# Patient Record
Sex: Male | Born: 1975 | Race: White | Hispanic: No | Marital: Married | Smoking: Current every day smoker
Health system: Southern US, Community
[De-identification: ages and names within clinical notes are randomized; demographics above are authoritative.]

## PROBLEM LIST (undated history)

## (undated) DIAGNOSIS — M1990 Unspecified osteoarthritis, unspecified site: Secondary | ICD-10-CM

## (undated) DIAGNOSIS — C801 Malignant (primary) neoplasm, unspecified: Secondary | ICD-10-CM

## (undated) DIAGNOSIS — Z889 Allergy status to unspecified drugs, medicaments and biological substances status: Secondary | ICD-10-CM

## (undated) DIAGNOSIS — K219 Gastro-esophageal reflux disease without esophagitis: Secondary | ICD-10-CM

## (undated) HISTORY — PX: KNEE ARTHROSCOPY: SHX127

## (undated) HISTORY — PX: BOWEL RESECTION: SHX1257

## (undated) HISTORY — PX: HERNIA REPAIR: SHX51

## (undated) HISTORY — PX: INGUINAL HERNIA REPAIR: SUR1180

---

## 2004-05-20 ENCOUNTER — Other Ambulatory Visit: Admission: RE | Admit: 2004-05-20 | Discharge: 2004-05-20 | Payer: Self-pay | Admitting: Dermatology

## 2005-10-22 ENCOUNTER — Ambulatory Visit (HOSPITAL_BASED_OUTPATIENT_CLINIC_OR_DEPARTMENT_OTHER): Admission: RE | Admit: 2005-10-22 | Discharge: 2005-10-22 | Payer: Self-pay | Admitting: General Surgery

## 2005-10-22 ENCOUNTER — Ambulatory Visit (HOSPITAL_COMMUNITY): Admission: RE | Admit: 2005-10-22 | Discharge: 2005-10-22 | Payer: Self-pay | Admitting: General Surgery

## 2017-09-20 ENCOUNTER — Emergency Department (HOSPITAL_COMMUNITY)
Admission: EM | Admit: 2017-09-20 | Discharge: 2017-09-20 | Disposition: A | Discharge status: Home / Self Care | Payer: BLUE CROSS/BLUE SHIELD | Attending: Emergency Medicine | Admitting: Emergency Medicine

## 2017-09-20 ENCOUNTER — Emergency Department (HOSPITAL_COMMUNITY): Payer: BLUE CROSS/BLUE SHIELD

## 2017-09-20 ENCOUNTER — Other Ambulatory Visit: Payer: Self-pay

## 2017-09-20 ENCOUNTER — Encounter (HOSPITAL_COMMUNITY): Payer: Self-pay

## 2017-09-20 DIAGNOSIS — Y929 Unspecified place or not applicable: Secondary | ICD-10-CM | POA: Diagnosis not present

## 2017-09-20 DIAGNOSIS — R10812 Left upper quadrant abdominal tenderness: Secondary | ICD-10-CM | POA: Diagnosis not present

## 2017-09-20 DIAGNOSIS — S3991XA Unspecified injury of abdomen, initial encounter: Secondary | ICD-10-CM | POA: Diagnosis present

## 2017-09-20 DIAGNOSIS — R10811 Right upper quadrant abdominal tenderness: Secondary | ICD-10-CM | POA: Diagnosis not present

## 2017-09-20 DIAGNOSIS — S3981XA Other specified injuries of abdomen, initial encounter: Secondary | ICD-10-CM | POA: Insufficient documentation

## 2017-09-20 DIAGNOSIS — W1789XA Other fall from one level to another, initial encounter: Secondary | ICD-10-CM | POA: Insufficient documentation

## 2017-09-20 DIAGNOSIS — F17210 Nicotine dependence, cigarettes, uncomplicated: Secondary | ICD-10-CM | POA: Diagnosis not present

## 2017-09-20 DIAGNOSIS — Y999 Unspecified external cause status: Secondary | ICD-10-CM | POA: Diagnosis not present

## 2017-09-20 DIAGNOSIS — R1011 Right upper quadrant pain: Secondary | ICD-10-CM | POA: Insufficient documentation

## 2017-09-20 DIAGNOSIS — R1012 Left upper quadrant pain: Secondary | ICD-10-CM | POA: Diagnosis not present

## 2017-09-20 DIAGNOSIS — Y939 Activity, unspecified: Secondary | ICD-10-CM | POA: Diagnosis not present

## 2017-09-20 LAB — COMPREHENSIVE METABOLIC PANEL
ALK PHOS: 88 U/L (ref 38–126)
ALT: 28 U/L (ref 17–63)
ANION GAP: 8 (ref 5–15)
AST: 33 U/L (ref 15–41)
Albumin: 4.5 g/dL (ref 3.5–5.0)
BILIRUBIN TOTAL: 0.7 mg/dL (ref 0.3–1.2)
BUN: 8 mg/dL (ref 6–20)
CO2: 24 mmol/L (ref 22–32)
Calcium: 9.1 mg/dL (ref 8.9–10.3)
Chloride: 102 mmol/L (ref 101–111)
Creatinine, Ser: 1 mg/dL (ref 0.61–1.24)
GLUCOSE: 106 mg/dL — AB (ref 65–99)
POTASSIUM: 3.3 mmol/L — AB (ref 3.5–5.1)
Sodium: 134 mmol/L — ABNORMAL LOW (ref 135–145)
TOTAL PROTEIN: 7.8 g/dL (ref 6.5–8.1)

## 2017-09-20 LAB — CBC WITH DIFFERENTIAL/PLATELET
BASOS ABS: 0 10*3/uL (ref 0.0–0.1)
BASOS PCT: 0 %
Eosinophils Absolute: 0 10*3/uL (ref 0.0–0.7)
Eosinophils Relative: 0 %
HEMATOCRIT: 44.2 % (ref 39.0–52.0)
HEMOGLOBIN: 14.6 g/dL (ref 13.0–17.0)
LYMPHS ABS: 2.1 10*3/uL (ref 0.7–4.0)
LYMPHS PCT: 8 %
MCH: 32.3 pg (ref 26.0–34.0)
MCHC: 33 g/dL (ref 30.0–36.0)
MCV: 97.8 fL (ref 78.0–100.0)
MONOS PCT: 6 %
Monocytes Absolute: 1.6 10*3/uL — ABNORMAL HIGH (ref 0.1–1.0)
NEUTROS ABS: 22.8 10*3/uL — AB (ref 1.7–7.7)
Neutrophils Relative %: 86 %
Platelets: 203 10*3/uL (ref 150–400)
RBC: 4.52 MIL/uL (ref 4.22–5.81)
RDW: 13.1 % (ref 11.5–15.5)
WBC: 26.5 10*3/uL — ABNORMAL HIGH (ref 4.0–10.5)

## 2017-09-20 LAB — I-STAT CHEM 8, ED
BUN: 6 mg/dL (ref 6–20)
CREATININE: 1 mg/dL (ref 0.61–1.24)
Calcium, Ion: 1.21 mmol/L (ref 1.15–1.40)
Chloride: 103 mmol/L (ref 101–111)
GLUCOSE: 105 mg/dL — AB (ref 65–99)
HEMATOCRIT: 48 % (ref 39.0–52.0)
HEMOGLOBIN: 16.3 g/dL (ref 13.0–17.0)
POTASSIUM: 3.5 mmol/L (ref 3.5–5.1)
Sodium: 141 mmol/L (ref 135–145)
TCO2: 29 mmol/L (ref 22–32)

## 2017-09-20 LAB — URINALYSIS, ROUTINE W REFLEX MICROSCOPIC
Bilirubin Urine: NEGATIVE
GLUCOSE, UA: NEGATIVE mg/dL
Ketones, ur: NEGATIVE mg/dL
LEUKOCYTES UA: NEGATIVE
NITRITE: NEGATIVE
PH: 6 (ref 5.0–8.0)
Protein, ur: 30 mg/dL — AB
SPECIFIC GRAVITY, URINE: 1.014 (ref 1.005–1.030)

## 2017-09-20 LAB — LIPASE, BLOOD: LIPASE: 31 U/L (ref 11–51)

## 2017-09-20 MED ORDER — ONDANSETRON HCL 4 MG PO TABS
4.0000 mg | ORAL_TABLET | Freq: Three times a day (TID) | ORAL | 0 refills | Status: DC | PRN
Start: 2017-09-20 — End: 2019-12-11

## 2017-09-20 MED ORDER — DOCUSATE SODIUM 100 MG PO CAPS
100.0000 mg | ORAL_CAPSULE | Freq: Once | ORAL | Status: AC
  Administered 2017-09-20: 100 mg via ORAL
  Filled 2017-09-20: qty 1

## 2017-09-20 MED ORDER — ONDANSETRON HCL 4 MG/2ML IJ SOLN
8.0000 mg | Freq: Once | INTRAMUSCULAR | Status: AC
  Administered 2017-09-20: 8 mg via INTRAVENOUS
  Filled 2017-09-20: qty 4

## 2017-09-20 MED ORDER — IOPAMIDOL (ISOVUE-300) INJECTION 61%
100.0000 mL | Freq: Once | INTRAVENOUS | Status: AC | PRN
  Administered 2017-09-20: 100 mL via INTRAVENOUS

## 2017-09-20 MED ORDER — MORPHINE SULFATE (PF) 4 MG/ML IV SOLN
4.0000 mg | Freq: Once | INTRAVENOUS | Status: AC
  Administered 2017-09-20: 4 mg via INTRAVENOUS
  Filled 2017-09-20: qty 1

## 2017-09-20 MED ORDER — ONDANSETRON HCL 4 MG PO TABS: 4.0000 mg | ORAL_TABLET | Freq: Three times a day (TID) | ORAL | 0 refills | Status: DC | PRN

## 2017-09-20 MED ORDER — OXYCODONE-ACETAMINOPHEN 5-325 MG PO TABS: 1.0000 | ORAL_TABLET | ORAL | 0 refills | Status: DC | PRN

## 2017-09-20 MED ORDER — HYDROCODONE-ACETAMINOPHEN 5-325 MG PO TABS: 1.0000 | ORAL_TABLET | Freq: Once | ORAL | Status: DC

## 2017-09-20 NOTE — ED Notes (Signed)
Pt c/o nausea. EDP notified and gave order for Zofran 8mg  IV.

## 2017-09-20 NOTE — ED Provider Notes (Signed)
Emergency Department Provider Note   I have reviewed the triage vital signs and the nursing notes.   HISTORY  Chief Complaint Abdominal Injury   HPI Roger Haney is a 41 y.o. male without significant past medical history presents after a fall.  States he is down on top of a skid steer lost his footing fell down landing on his upper abdomen on a bar that goes across the back viscus tear.  Same done he had his shin on the top of the cage but mostly is concerned about severe abdominal pain this time.  States it is on his bilateral upper quadrants and consistently since the fall without change.  Has not tried anything at home for his symptoms.  No injuries elsewhere.  No other associated or modifying symptoms.  No alcohol or drugs today.   History reviewed. No pertinent past medical history.  There are no active problems to display for this patient.   History reviewed. No pertinent surgical history.    Allergies Patient has no known allergies.  No family history on file.  Social History Social History   Tobacco Use  . Smoking status: Current Every Day Smoker    Packs/day: 1.00    Types: Cigarettes  . Smokeless tobacco: Never Used  Substance Use Topics  . Alcohol use: Yes    Comment: occ  . Drug use: No    Review of Systems  All other systems negative except as documented in the HPI. All pertinent positives and negatives as reviewed in the HPI. ____________________________________________   PHYSICAL EXAM:  VITAL SIGNS: ED Triage Vitals  Enc Vitals Group     BP 09/20/17 1405 114/75     Pulse Rate 09/20/17 1405 75     Resp 09/20/17 1405 16     Temp 09/20/17 1405 (!) 97.5 F (36.4 C)     Temp Source 09/20/17 1405 Oral     SpO2 09/20/17 1405 98 %     Weight 09/20/17 1405 140 lb (63.5 kg)     Height 09/20/17 1405 5\' 8"  (1.727 m)    Constitutional: Alert and oriented. Well appearing and in no acute distress. Eyes: Conjunctivae are normal. PERRL.  EOMI. Head: Atraumatic. Chin without obvious injury or deformity.  Nose: No congestion/rhinnorhea. Mouth/Throat: normal dentition, no malocclusion. Mucous membranes are moist.  Oropharynx non-erythematous. Neck: No stridor.  No meningeal signs.   Cardiovascular: Normal rate, regular rhythm. Good peripheral circulation. Grossly normal heart sounds.   Respiratory: Normal respiratory effort.  No retractions. Lungs CTAB. Gastrointestinal: Soft and tenderness with guarding to bilateral upper quadrants and epigastrium. No distention.  Musculoskeletal: No lower extremity tenderness nor edema. No gross deformities of extremities. Neurologic:  Normal speech and language. No gross focal neurologic deficits are appreciated.  Skin:  Skin is warm, dry and intact. No rash noted.   ____________________________________________   LABS (all labs ordered are listed, but only abnormal results are displayed)  Labs Reviewed  CBC WITH DIFFERENTIAL/PLATELET - Abnormal; Notable for the following components:      Result Value   WBC 26.5 (*)    Neutro Abs 22.8 (*)    Monocytes Absolute 1.6 (*)    All other components within normal limits  COMPREHENSIVE METABOLIC PANEL - Abnormal; Notable for the following components:   Sodium 134 (*)    Potassium 3.3 (*)    Glucose, Bld 106 (*)    All other components within normal limits  URINALYSIS, ROUTINE W REFLEX MICROSCOPIC - Abnormal; Notable for  the following components:   APPearance HAZY (*)    Hgb urine dipstick SMALL (*)    Protein, ur 30 (*)    Bacteria, UA RARE (*)    Squamous Epithelial / LPF 0-5 (*)    All other components within normal limits  I-STAT CHEM 8, ED - Abnormal; Notable for the following components:   Glucose, Bld 105 (*)    All other components within normal limits  LIPASE, BLOOD   ____________________________________________  RADIOLOGY  Ct Chest W Contrast  Result Date: 09/20/2017 CLINICAL DATA:  Pain after fall today. Patient fell  onto a bobcat machine off another bobcat. Severe abdominal pain. EXAM: CT CHEST, ABDOMEN, AND PELVIS WITH CONTRAST TECHNIQUE: Multidetector CT imaging of the chest, abdomen and pelvis was performed following the standard protocol during bolus administration of intravenous contrast. CONTRAST:  100mL ISOVUE-300 IOPAMIDOL (ISOVUE-300) INJECTION 61% COMPARISON:  None. FINDINGS: CT CHEST FINDINGS Cardiovascular: Normal branch pattern of the great vessels. No aortic aneurysm or dissection. No large central pulmonary embolus. No pericardial effusion. Normal heart size. Mediastinum/Nodes: No mediastinal widening nor evidence of mediastinal hematoma. No adenopathy. Intact trachea esophagus. Unremarkable thyroid. Lungs/Pleura: Bibasilar dependent atelectasis. Upper lobe predominant centrilobular emphysema with small subpleural bleb in the right upper lobe. Musculoskeletal: No chest wall mass or suspicious bone lesions identified. CT ABDOMEN PELVIS FINDINGS Hepatobiliary: No hepatic injury or perihepatic hematoma. Gallbladder is unremarkable Pancreas: Unremarkable. No pancreatic ductal dilatation or surrounding inflammatory changes. Spleen: No splenic injury or perisplenic hematoma. Adrenals/Urinary Tract: No adrenal hemorrhage or renal injury identified. Bladder is unremarkable. Stomach/Bowel: Stomach is within normal limits. Appendix appears normal. No evidence of bowel wall thickening, distention, or inflammatory changes. Vascular/Lymphatic: No significant vascular findings are present. No enlarged abdominal or pelvic lymph nodes. Reproductive: Prostate is unremarkable. Other: No abdominal wall hernia or abnormality. Trace to small amount of free fluid in the pelvis. Musculoskeletal: No acute or significant osseous findings. IMPRESSION: 1. Centrilobular emphysema, upper lobe predominant. No pulmonary contusion, effusion or pneumothorax. 2. No evidence of mediastinal hematoma. 3. No acute solid nor hollow visceral organ  injury. Trace to small amount of nonspecific free fluid in the pelvis without etiology. Electronically Signed   By: Tollie Ethavid  Kwon M.D.   On: 09/20/2017 17:22   Ct Abdomen Pelvis W Contrast  Result Date: 09/20/2017 CLINICAL DATA:  Pain after fall today. Patient fell onto a bobcat machine off another bobcat. Severe abdominal pain. EXAM: CT CHEST, ABDOMEN, AND PELVIS WITH CONTRAST TECHNIQUE: Multidetector CT imaging of the chest, abdomen and pelvis was performed following the standard protocol during bolus administration of intravenous contrast. CONTRAST:  100mL ISOVUE-300 IOPAMIDOL (ISOVUE-300) INJECTION 61% COMPARISON:  None. FINDINGS: CT CHEST FINDINGS Cardiovascular: Normal branch pattern of the great vessels. No aortic aneurysm or dissection. No large central pulmonary embolus. No pericardial effusion. Normal heart size. Mediastinum/Nodes: No mediastinal widening nor evidence of mediastinal hematoma. No adenopathy. Intact trachea esophagus. Unremarkable thyroid. Lungs/Pleura: Bibasilar dependent atelectasis. Upper lobe predominant centrilobular emphysema with small subpleural bleb in the right upper lobe. Musculoskeletal: No chest wall mass or suspicious bone lesions identified. CT ABDOMEN PELVIS FINDINGS Hepatobiliary: No hepatic injury or perihepatic hematoma. Gallbladder is unremarkable Pancreas: Unremarkable. No pancreatic ductal dilatation or surrounding inflammatory changes. Spleen: No splenic injury or perisplenic hematoma. Adrenals/Urinary Tract: No adrenal hemorrhage or renal injury identified. Bladder is unremarkable. Stomach/Bowel: Stomach is within normal limits. Appendix appears normal. No evidence of bowel wall thickening, distention, or inflammatory changes. Vascular/Lymphatic: No significant vascular findings are present. No enlarged abdominal  or pelvic lymph nodes. Reproductive: Prostate is unremarkable. Other: No abdominal wall hernia or abnormality. Trace to small amount of free fluid in the  pelvis. Musculoskeletal: No acute or significant osseous findings. IMPRESSION: 1. Centrilobular emphysema, upper lobe predominant. No pulmonary contusion, effusion or pneumothorax. 2. No evidence of mediastinal hematoma. 3. No acute solid nor hollow visceral organ injury. Trace to small amount of nonspecific free fluid in the pelvis without etiology. Electronically Signed   By: Tollie Eth M.D.   On: 09/20/2017 17:22    ____________________________________________   PROCEDURES  Procedure(s) performed:   Procedures   ____________________________________________   INITIAL IMPRESSION / ASSESSMENT AND PLAN / ED COURSE  Pertinent labs & imaging results that were available during my care of the patient were reviewed by me and considered in my medical decision making (see chart for details).  Concern for possible intra-abdominal injury from the fall.  Will CT chest and abdomen.  We will also check labs, pain medicine at this time.  N.p.o.  T okay.  Pain improved.  Nausea after pain medicine improved with Zofran.  Still has some tenderness in his bilateral upper quadrant suspect possible occult rib fracture versus contusions.  Does have diffuse pain still feel less likely this is a hollow viscus injury at this point however strict return precautions if any worsening pain to return here for the same  As these are not always evident on initial CT scans.  DC'd on pain medication, stool softeners and nausea medication.   ____________________________________________  FINAL CLINICAL IMPRESSION(S) / ED DIAGNOSES  Final diagnoses:  Blunt trauma to abdomen, initial encounter     MEDICATIONS GIVEN DURING THIS VISIT:  Medications  HYDROcodone-acetaminophen (NORCO/VICODIN) 5-325 MG per tablet 1 tablet (1 tablet Oral Refused 09/20/17 1846)  morphine 4 MG/ML injection 4 mg (4 mg Intravenous Given 09/20/17 1451)  iopamidol (ISOVUE-300) 61 % injection 100 mL (100 mLs Intravenous Contrast Given  09/20/17 1658)  ondansetron (ZOFRAN) injection 8 mg (8 mg Intravenous Given 09/20/17 1841)  docusate sodium (COLACE) capsule 100 mg (100 mg Oral Given 09/20/17 1934)     NEW OUTPATIENT MEDICATIONS STARTED DURING THIS VISIT:  This SmartLink is deprecated. Use AVSMEDLIST instead to display the medication list for a patient.  Note:  This document was prepared using Dragon voice recognition software and may include unintentional dictation errors.   Marily Memos, MD 09/20/17 2217

## 2017-09-20 NOTE — ED Triage Notes (Signed)
Patient reports of being on back of bob cat and fell onto another part of bob cat onto abdomen and chin. Patient unable to sit still in triage.

## 2017-09-22 MED FILL — Ondansetron HCl Tab 4 MG: ORAL | Qty: 4 | Status: AC

## 2017-09-22 MED FILL — Oxycodone w/ Acetaminophen Tab 5-325 MG: ORAL | Qty: 6 | Status: AC

## 2019-12-11 ENCOUNTER — Encounter (HOSPITAL_BASED_OUTPATIENT_CLINIC_OR_DEPARTMENT_OTHER): Payer: Self-pay | Admitting: Orthopaedic Surgery

## 2019-12-11 ENCOUNTER — Other Ambulatory Visit: Payer: Self-pay

## 2019-12-11 ENCOUNTER — Other Ambulatory Visit (HOSPITAL_COMMUNITY)
Admission: RE | Admit: 2019-12-11 | Discharge: 2019-12-11 | Disposition: A | Discharge status: Home / Self Care | Payer: BC Managed Care – PPO | Source: Ambulatory Visit | Attending: Orthopaedic Surgery | Admitting: Orthopaedic Surgery

## 2019-12-11 DIAGNOSIS — Z20822 Contact with and (suspected) exposure to covid-19: Secondary | ICD-10-CM | POA: Diagnosis not present

## 2019-12-11 DIAGNOSIS — Z01812 Encounter for preprocedural laboratory examination: Secondary | ICD-10-CM | POA: Diagnosis present

## 2019-12-11 LAB — SARS CORONAVIRUS 2 (TAT 6-24 HRS): SARS Coronavirus 2: NEGATIVE

## 2019-12-11 NOTE — Consult Note (Signed)
ORTHOPAEDIC CONSULTATION  REQUESTING PHYSICIAN: Hiram Gash, MD  Chief Complaint: L tibial fracture  HPI: Roger Haney is a 44 y.o. male with fall on 12/09/2019 late in the evening resulting in a left leg injury.  He was seen in urgent care the next day was noted to have a displaced distal diaphyseal tibial shaft fracture with associated fibula fracture proximally.  No other areas of injury.  Patient no major medical condition history though is a 1-1/2 pack/day smoker.  He works in [employer].  He was splinted and sent home as he desired outpatient surgery.  Past Medical History:  Diagnosis Date  . Arthritis   . GERD (gastroesophageal reflux disease)   . H/O seasonal allergies    Past Surgical History:  Procedure Laterality Date  . BOWEL RESECTION    . HERNIA REPAIR     x2  . KNEE ARTHROSCOPY     Social History   Socioeconomic History  . Marital status: Married    Spouse name: Not on file  . Number of children: Not on file  . Years of education: Not on file  . Highest education level: Not on file  Occupational History  . Not on file  Tobacco Use  . Smoking status: Current Every Day Smoker    Packs/day: 1.00    Types: Cigarettes  . Smokeless tobacco: Never Used  Substance and Sexual Activity  . Alcohol use: Yes    Comment: occ  . Drug use: No  . Sexual activity: Not on file  Other Topics Concern  . Not on file  Social History Narrative  . Not on file   Social Determinants of Health   Financial Resource Strain:   . Difficulty of Paying Living Expenses: Not on file  Food Insecurity:   . Worried About Charity fundraiser in the Last Year: Not on file  . Ran Out of Food in the Last Year: Not on file  Transportation Needs:   . Lack of Transportation (Medical): Not on file  . Lack of Transportation (Non-Medical): Not on file  Physical Activity:   . Days of Exercise per Week: Not on file  . Minutes of Exercise per Session: Not on file  Stress:   .  Feeling of Stress : Not on file  Social Connections:   . Frequency of Communication with Friends and Family: Not on file  . Frequency of Social Gatherings with Friends and Family: Not on file  . Attends Religious Services: Not on file  . Active Member of Clubs or Organizations: Not on file  . Attends Archivist Meetings: Not on file  . Marital Status: Not on file   History reviewed. No pertinent family history. No Known Allergies Prior to Admission medications   Medication Sig Start Date End Date Taking? Authorizing Provider  fexofenadine (ALLEGRA) 60 MG tablet Take 60 mg by mouth 2 (two) times daily.   Yes [provider]  fluticasone (FLONASE) 50 MCG/ACT nasal spray Place 1 spray daily as needed into the nose for congestion. 07/13/17  Yes [provider]  HYDROcodone-acetaminophen (NORCO/VICODIN) 5-325 MG tablet Take 1 tablet by mouth every 6 (six) hours as needed for moderate pain.   Yes [provider]  Multiple Vitamin (MULTIVITAMIN) tablet Take 1 tablet by mouth daily.   Yes [provider]  omeprazole (PRILOSEC) 40 MG capsule Take 40 mg daily by mouth.   Yes [provider]   No results found. Family History  Reviewed and non-contributory, no pertinent history of problems with bleeding or anesthesia      Review of Systems 14 system ROS conducted and negative except for that noted in HPI   OBJECTIVE  Vitals: Patient Vitals for the past 8 hrs:  Height Weight  12/11/19 0940 5' 7.5" (1.715 m) 66.7 kg   Phone note done as patient was unavailable for physical exam and wanted to be met preop.    Test Results Imaging Reviewed imaging from our clinic demonstrating a distal diaphyseal displaced tibia fracture with associated high fibula fracture.  Labs cbc No results for input(s): WBC, HGB, HCT, PLT in the last 72 hours.  Labs inflam No results for input(s): CRP in the last 72 hours.  Invalid input(s): ESR  Labs  coag No results for input(s): INR, PTT in the last 72 hours.  Invalid input(s): PT  No results for input(s): NA, K, CL, CO2, GLUCOSE, BUN, CREATININE, CALCIUM in the last 72 hours.   ASSESSMENT AND PLAN: 44 y.o. male with the following: Distal diaphyseal left tibial fracture  The patient's smoking history as well as high function would lead [place] toward surgical management versus casting.  Risks and benefits were discussed of the intramedullary nail in the tibia.  He understands specific risk with infection, nonunion, malunion, knee pain and compartment syndrome.  Understanding these risks he elected to proceed.  We will work on surgical scheduling and plan for surgery in the upcoming days.

## 2019-12-11 NOTE — H&P (View-Only) (Signed)
ORTHOPAEDIC CONSULTATION  REQUESTING PHYSICIAN: Hiram Gash, MD  Chief Complaint: L tibial fracture  HPI: Roger Haney is a 44 y.o. male with fall on 12/09/2019 late in the evening resulting in a left leg injury.  He was seen in urgent care the next day was noted to have a displaced distal diaphyseal tibial shaft fracture with associated fibula fracture proximally.  No other areas of injury.  Patient no major medical condition history though is a 1-1/2 pack/day smoker.  He works in [employer].  He was splinted and sent home as he desired outpatient surgery.  Past Medical History:  Diagnosis Date  . Arthritis   . GERD (gastroesophageal reflux disease)   . H/O seasonal allergies    Past Surgical History:  Procedure Laterality Date  . BOWEL RESECTION    . HERNIA REPAIR     x2  . KNEE ARTHROSCOPY     Social History   Socioeconomic History  . Marital status: Married    Spouse name: Not on file  . Number of children: Not on file  . Years of education: Not on file  . Highest education level: Not on file  Occupational History  . Not on file  Tobacco Use  . Smoking status: Current Every Day Smoker    Packs/day: 1.00    Types: Cigarettes  . Smokeless tobacco: Never Used  Substance and Sexual Activity  . Alcohol use: Yes    Comment: occ  . Drug use: No  . Sexual activity: Not on file  Other Topics Concern  . Not on file  Social History Narrative  . Not on file   Social Determinants of Health   Financial Resource Strain:   . Difficulty of Paying Living Expenses: Not on file  Food Insecurity:   . Worried About Charity fundraiser in the Last Year: Not on file  . Ran Out of Food in the Last Year: Not on file  Transportation Needs:   . Lack of Transportation (Medical): Not on file  . Lack of Transportation (Non-Medical): Not on file  Physical Activity:   . Days of Exercise per Week: Not on file  . Minutes of Exercise per Session: Not on file  Stress:   .  Feeling of Stress : Not on file  Social Connections:   . Frequency of Communication with Friends and Family: Not on file  . Frequency of Social Gatherings with Friends and Family: Not on file  . Attends Religious Services: Not on file  . Active Member of Clubs or Organizations: Not on file  . Attends Archivist Meetings: Not on file  . Marital Status: Not on file   History reviewed. No pertinent family history. No Known Allergies Prior to Admission medications   Medication Sig Start Date End Date Taking? Authorizing Provider  fexofenadine (ALLEGRA) 60 MG tablet Take 60 mg by mouth 2 (two) times daily.   Yes [provider]  fluticasone (FLONASE) 50 MCG/ACT nasal spray Place 1 spray daily as needed into the nose for congestion. 07/13/17  Yes [provider]  HYDROcodone-acetaminophen (NORCO/VICODIN) 5-325 MG tablet Take 1 tablet by mouth every 6 (six) hours as needed for moderate pain.   Yes [provider]  Multiple Vitamin (MULTIVITAMIN) tablet Take 1 tablet by mouth daily.   Yes [provider]  omeprazole (PRILOSEC) 40 MG capsule Take 40 mg daily by mouth.   Yes [provider]   No results found. Family History  Reviewed and non-contributory, no pertinent history of problems with bleeding or anesthesia      Review of Systems 14 system ROS conducted and negative except for that noted in HPI   OBJECTIVE  Vitals: Patient Vitals for the past 8 hrs:  Height Weight  12/11/19 0940 5' 7.5" (1.715 m) 66.7 kg   Phone note done as patient was unavailable for physical exam and wanted to be met preop.    Test Results Imaging Reviewed imaging from our clinic demonstrating a distal diaphyseal displaced tibia fracture with associated high fibula fracture.  Labs cbc No results for input(s): WBC, HGB, HCT, PLT in the last 72 hours.  Labs inflam No results for input(s): CRP in the last 72 hours.  Invalid input(s): ESR  Labs  coag No results for input(s): INR, PTT in the last 72 hours.  Invalid input(s): PT  No results for input(s): NA, K, CL, CO2, GLUCOSE, BUN, CREATININE, CALCIUM in the last 72 hours.   ASSESSMENT AND PLAN: 44 y.o. male with the following: Distal diaphyseal left tibial fracture  The patient's smoking history as well as high function would lead [place] toward surgical management versus casting.  Risks and benefits were discussed of the intramedullary nail in the tibia.  He understands specific risk with infection, nonunion, malunion, knee pain and compartment syndrome.  Understanding these risks he elected to proceed.  We will work on surgical scheduling and plan for surgery in the upcoming days.

## 2019-12-13 ENCOUNTER — Encounter (HOSPITAL_BASED_OUTPATIENT_CLINIC_OR_DEPARTMENT_OTHER)
Admission: RE | Disposition: A | Discharge status: Home / Self Care | Payer: Self-pay | Source: Home / Self Care | Attending: Orthopaedic Surgery

## 2019-12-13 ENCOUNTER — Ambulatory Visit (HOSPITAL_BASED_OUTPATIENT_CLINIC_OR_DEPARTMENT_OTHER)
Admission: RE | Admit: 2019-12-13 | Discharge: 2019-12-13 | Disposition: A | Discharge status: Home / Self Care | Payer: BC Managed Care – PPO | Attending: Orthopaedic Surgery | Admitting: Orthopaedic Surgery

## 2019-12-13 ENCOUNTER — Ambulatory Visit (HOSPITAL_BASED_OUTPATIENT_CLINIC_OR_DEPARTMENT_OTHER): Payer: BC Managed Care – PPO | Admitting: Anesthesiology

## 2019-12-13 ENCOUNTER — Other Ambulatory Visit: Payer: Self-pay

## 2019-12-13 ENCOUNTER — Encounter (HOSPITAL_BASED_OUTPATIENT_CLINIC_OR_DEPARTMENT_OTHER): Payer: Self-pay | Admitting: Orthopaedic Surgery

## 2019-12-13 ENCOUNTER — Ambulatory Visit (HOSPITAL_COMMUNITY): Payer: BC Managed Care – PPO

## 2019-12-13 DIAGNOSIS — W19XXXA Unspecified fall, initial encounter: Secondary | ICD-10-CM | POA: Insufficient documentation

## 2019-12-13 DIAGNOSIS — Z419 Encounter for procedure for purposes other than remedying health state, unspecified: Secondary | ICD-10-CM

## 2019-12-13 DIAGNOSIS — F17210 Nicotine dependence, cigarettes, uncomplicated: Secondary | ICD-10-CM | POA: Diagnosis not present

## 2019-12-13 DIAGNOSIS — S82202A Unspecified fracture of shaft of left tibia, initial encounter for closed fracture: Secondary | ICD-10-CM | POA: Insufficient documentation

## 2019-12-13 DIAGNOSIS — K219 Gastro-esophageal reflux disease without esophagitis: Secondary | ICD-10-CM | POA: Diagnosis not present

## 2019-12-13 HISTORY — DX: Gastro-esophageal reflux disease without esophagitis: K21.9

## 2019-12-13 HISTORY — DX: Unspecified osteoarthritis, unspecified site: M19.90

## 2019-12-13 HISTORY — DX: Allergy status to unspecified drugs, medicaments and biological substances: Z88.9

## 2019-12-13 HISTORY — PX: BILATERAL INTRAMEDULLARY (IM) NAIL TIBIAL: SHX6781

## 2019-12-13 SURGERY — BILATERAL INTRAMEDULLARY (IM) NAIL TIBIAL
Anesthesia: General | Site: Leg Lower | Laterality: Left

## 2019-12-13 MED ORDER — LIDOCAINE 2% (20 MG/ML) 5 ML SYRINGE
INTRAMUSCULAR | Status: AC
  Filled 2019-12-13: qty 5

## 2019-12-13 MED ORDER — PROPOFOL 10 MG/ML IV BOLUS
INTRAVENOUS | Status: DC | PRN
  Administered 2019-12-13: 200 mg via INTRAVENOUS

## 2019-12-13 MED ORDER — ONDANSETRON HCL 4 MG PO TABS: 4.0000 mg | ORAL_TABLET | Freq: Three times a day (TID) | ORAL | 1 refills | Status: AC | PRN

## 2019-12-13 MED ORDER — FENTANYL CITRATE (PF) 100 MCG/2ML IJ SOLN
INTRAMUSCULAR | Status: DC | PRN
  Administered 2019-12-13 (×6): 50 ug via INTRAVENOUS

## 2019-12-13 MED ORDER — CELECOXIB 100 MG PO CAPS: 100.0000 mg | ORAL_CAPSULE | Freq: Two times a day (BID) | ORAL | 2 refills | Status: AC

## 2019-12-13 MED ORDER — ACETAMINOPHEN 500 MG PO TABS: 1000.0000 mg | ORAL_TABLET | Freq: Three times a day (TID) | ORAL | 0 refills | Status: AC

## 2019-12-13 MED ORDER — OXYCODONE HCL 5 MG PO TABS
5.0000 mg | ORAL_TABLET | Freq: Once | ORAL | Status: AC
  Administered 2019-12-13: 5 mg via ORAL

## 2019-12-13 MED ORDER — CEFAZOLIN SODIUM-DEXTROSE 2-4 GM/100ML-% IV SOLN
INTRAVENOUS | Status: AC
  Filled 2019-12-13: qty 100

## 2019-12-13 MED ORDER — BUPIVACAINE HCL (PF) 0.25 % IJ SOLN
INTRAMUSCULAR | Status: DC | PRN
  Administered 2019-12-13: 30 mL

## 2019-12-13 MED ORDER — OXYCODONE HCL 5 MG PO TABS: ORAL_TABLET | ORAL | 0 refills | Status: AC

## 2019-12-13 MED ORDER — DEXAMETHASONE SODIUM PHOSPHATE 10 MG/ML IJ SOLN
INTRAMUSCULAR | Status: DC | PRN
  Administered 2019-12-13: 10 mg via INTRAVENOUS

## 2019-12-13 MED ORDER — MIDAZOLAM HCL 5 MG/5ML IJ SOLN
INTRAMUSCULAR | Status: DC | PRN
  Administered 2019-12-13: 2 mg via INTRAVENOUS

## 2019-12-13 MED ORDER — MEPERIDINE HCL 25 MG/ML IJ SOLN: 6.2500 mg | INTRAMUSCULAR | Status: DC | PRN

## 2019-12-13 MED ORDER — ACETAMINOPHEN 500 MG PO TABS
ORAL_TABLET | ORAL | Status: AC
  Filled 2019-12-13: qty 2

## 2019-12-13 MED ORDER — DEXAMETHASONE SODIUM PHOSPHATE 10 MG/ML IJ SOLN
INTRAMUSCULAR | Status: AC
  Filled 2019-12-13: qty 1

## 2019-12-13 MED ORDER — MIDAZOLAM HCL 2 MG/2ML IJ SOLN
INTRAMUSCULAR | Status: AC
  Filled 2019-12-13: qty 2

## 2019-12-13 MED ORDER — ONDANSETRON HCL 4 MG/2ML IJ SOLN
INTRAMUSCULAR | Status: AC
  Filled 2019-12-13: qty 2

## 2019-12-13 MED ORDER — HYDROMORPHONE HCL 1 MG/ML IJ SOLN
INTRAMUSCULAR | Status: AC
  Filled 2019-12-13: qty 0.5

## 2019-12-13 MED ORDER — LIDOCAINE 2% (20 MG/ML) 5 ML SYRINGE
INTRAMUSCULAR | Status: DC | PRN
  Administered 2019-12-13 (×2): 50 mg via INTRAVENOUS

## 2019-12-13 MED ORDER — LACTATED RINGERS IV SOLN: INTRAVENOUS | Status: DC

## 2019-12-13 MED ORDER — DEXMEDETOMIDINE HCL 200 MCG/2ML IV SOLN
INTRAVENOUS | Status: DC | PRN
  Administered 2019-12-13: 16 ug via INTRAVENOUS
  Administered 2019-12-13: 8 ug via INTRAVENOUS

## 2019-12-13 MED ORDER — KETOROLAC TROMETHAMINE 30 MG/ML IJ SOLN
INTRAMUSCULAR | Status: AC
  Filled 2019-12-13: qty 1

## 2019-12-13 MED ORDER — VANCOMYCIN HCL 1000 MG IV SOLR
INTRAVENOUS | Status: DC | PRN
  Administered 2019-12-13: 1000 mg

## 2019-12-13 MED ORDER — FENTANYL CITRATE (PF) 100 MCG/2ML IJ SOLN
INTRAMUSCULAR | Status: AC
  Filled 2019-12-13: qty 2

## 2019-12-13 MED ORDER — CEFAZOLIN SODIUM-DEXTROSE 2-4 GM/100ML-% IV SOLN
2.0000 g | INTRAVENOUS | Status: AC
  Administered 2019-12-13: 2 g via INTRAVENOUS

## 2019-12-13 MED ORDER — OXYCODONE HCL 5 MG PO TABS
ORAL_TABLET | ORAL | Status: AC
  Filled 2019-12-13: qty 1

## 2019-12-13 MED ORDER — HYDROMORPHONE HCL 1 MG/ML IJ SOLN: 0.5000 mg | Freq: Once | INTRAMUSCULAR | Status: DC

## 2019-12-13 MED ORDER — ONDANSETRON HCL 4 MG/2ML IJ SOLN: 4.0000 mg | Freq: Once | INTRAMUSCULAR | Status: DC | PRN

## 2019-12-13 MED ORDER — OXYCODONE-ACETAMINOPHEN 5-325 MG PO TABS
ORAL_TABLET | ORAL | Status: AC
  Filled 2019-12-13: qty 1

## 2019-12-13 MED ORDER — ONDANSETRON HCL 4 MG/2ML IJ SOLN
INTRAMUSCULAR | Status: DC | PRN
  Administered 2019-12-13: 4 mg via INTRAVENOUS

## 2019-12-13 MED ORDER — VANCOMYCIN HCL 1000 MG IV SOLR
INTRAVENOUS | Status: AC
  Filled 2019-12-13: qty 1000

## 2019-12-13 MED ORDER — KETOROLAC TROMETHAMINE 30 MG/ML IJ SOLN
30.0000 mg | Freq: Once | INTRAMUSCULAR | Status: AC
  Administered 2019-12-13: 30 mg via INTRAVENOUS

## 2019-12-13 MED ORDER — BUPIVACAINE HCL (PF) 0.25 % IJ SOLN
INTRAMUSCULAR | Status: AC
  Filled 2019-12-13: qty 30

## 2019-12-13 MED ORDER — ASPIRIN 81 MG PO CHEW: 81.0000 mg | CHEWABLE_TABLET | Freq: Two times a day (BID) | ORAL | 1 refills | Status: AC

## 2019-12-13 MED ORDER — ACETAMINOPHEN 500 MG PO TABS
1000.0000 mg | ORAL_TABLET | Freq: Once | ORAL | Status: AC
  Administered 2019-12-13: 1000 mg via ORAL

## 2019-12-13 MED ORDER — CHLORHEXIDINE GLUCONATE 4 % EX LIQD: 60.0000 mL | Freq: Once | CUTANEOUS | Status: DC

## 2019-12-13 MED ORDER — HYDROMORPHONE HCL 1 MG/ML IJ SOLN
0.2500 mg | INTRAMUSCULAR | Status: DC | PRN
  Administered 2019-12-13 (×5): 0.5 mg via INTRAVENOUS

## 2019-12-13 SURGICAL SUPPLY — 82 items
APL PRP STRL LF DISP 70% ISPRP (MISCELLANEOUS) ×1
BAG DECANTER FOR FLEXI CONT (MISCELLANEOUS) IMPLANT
BANDAGE ESMARK 6X9 LF (GAUZE/BANDAGES/DRESSINGS) IMPLANT
BIT DRILL CALIBRATED 4.3X320MM (BIT) IMPLANT
BIT DRILL CROWE POINT TWST 4.3 (DRILL) IMPLANT
BLADE SURG 10 STRL SS (BLADE) ×3 IMPLANT
BLADE SURG 15 STRL LF DISP TIS (BLADE) ×1 IMPLANT
BLADE SURG 15 STRL SS (BLADE) ×3
BNDG CMPR 9X6 STRL LF SNTH (GAUZE/BANDAGES/DRESSINGS)
BNDG COHESIVE 4X5 TAN STRL (GAUZE/BANDAGES/DRESSINGS) ×1 IMPLANT
BNDG ELASTIC 6X5.8 VLCR STR LF (GAUZE/BANDAGES/DRESSINGS) ×5 IMPLANT
BNDG ESMARK 6X9 LF (GAUZE/BANDAGES/DRESSINGS)
BOOT STEPPER DURA LG (SOFTGOODS) IMPLANT
CANISTER SUCT 1200ML W/VALVE (MISCELLANEOUS) ×3 IMPLANT
CHLORAPREP W/TINT 26 (MISCELLANEOUS) ×3 IMPLANT
CLOSURE STERI-STRIP 1/2X4 (GAUZE/BANDAGES/DRESSINGS) ×2
CLSR STERI-STRIP ANTIMIC 1/2X4 (GAUZE/BANDAGES/DRESSINGS) ×3 IMPLANT
COVER BACK TABLE 60X90IN (DRAPES) ×3 IMPLANT
COVER WAND RF STERILE (DRAPES) IMPLANT
CUFF TOURN SGL QUICK 34 (TOURNIQUET CUFF) ×3
CUFF TRNQT CYL 34X4.125X (TOURNIQUET CUFF) IMPLANT
DRAPE C-ARM 42X72 X-RAY (DRAPES) ×3 IMPLANT
DRAPE C-ARMOR (DRAPES) ×3 IMPLANT
DRAPE EXTREMITY T 121X128X90 (DISPOSABLE) ×3 IMPLANT
DRAPE IMP U-DRAPE 54X76 (DRAPES) ×3 IMPLANT
DRAPE U-SHAPE 47X51 STRL (DRAPES) ×2 IMPLANT
DRILL CALIBRATED 4.3X320MM (BIT) ×3
DRILL CROWE POINT TWIST 4.3 (DRILL) ×6
DRSG ADAPTIC 3X8 NADH LF (GAUZE/BANDAGES/DRESSINGS) ×2 IMPLANT
DRSG AQUACEL AG ADV 3.5X 6 (GAUZE/BANDAGES/DRESSINGS) ×2 IMPLANT
DRSG PAD ABDOMINAL 8X10 ST (GAUZE/BANDAGES/DRESSINGS) ×2 IMPLANT
ELECT REM PT RETURN 9FT ADLT (ELECTROSURGICAL) ×3
ELECTRODE REM PT RTRN 9FT ADLT (ELECTROSURGICAL) ×1 IMPLANT
GLOVE BIO SURGEON STRL SZ 6.5 (GLOVE) ×1 IMPLANT
GLOVE BIO SURGEON STRL SZ7 (GLOVE) ×1 IMPLANT
GLOVE BIO SURGEONS STRL SZ 6.5 (GLOVE) ×1
GLOVE BIOGEL PI IND STRL 7.0 (GLOVE) ×1 IMPLANT
GLOVE BIOGEL PI IND STRL 8 (GLOVE) ×2 IMPLANT
GLOVE BIOGEL PI INDICATOR 7.0 (GLOVE) ×8
GLOVE BIOGEL PI INDICATOR 8 (GLOVE) ×4
GLOVE ECLIPSE 6.5 STRL STRAW (GLOVE) ×6 IMPLANT
GLOVE ECLIPSE 8.0 STRL XLNG CF (GLOVE) ×3 IMPLANT
GOWN STRL REUS W/ TWL LRG LVL3 (GOWN DISPOSABLE) ×2 IMPLANT
GOWN STRL REUS W/ TWL XL LVL3 (GOWN DISPOSABLE) ×1 IMPLANT
GOWN STRL REUS W/TWL LRG LVL3 (GOWN DISPOSABLE) ×12
GOWN STRL REUS W/TWL XL LVL3 (GOWN DISPOSABLE) ×3
GUIDEPIN 3.2X17.5 THRD DISP (PIN) ×2 IMPLANT
GUIDEWIRE 2.6X80 BEAD TIP (WIRE) IMPLANT
GUIDWIRE 2.6X80 BEAD TIP (WIRE) ×3
IMMOBILIZER KNEE 22 UNIV (SOFTGOODS) IMPLANT
IMMOBILIZER KNEE 24 THIGH 36 (MISCELLANEOUS) IMPLANT
IMMOBILIZER KNEE 24 UNIV (MISCELLANEOUS)
NAIL TIBIAL PHOENIX 10.5X330 (Nail) ×2 IMPLANT
NDL HYPO 25X1 1.5 SAFETY (NEEDLE) IMPLANT
NEEDLE HYPO 25X1 1.5 SAFETY (NEEDLE) IMPLANT
NS IRRIG 1000ML POUR BTL (IV SOLUTION) ×3 IMPLANT
PACK ARTHROSCOPY DSU (CUSTOM PROCEDURE TRAY) ×2 IMPLANT
PACK BASIN DAY SURGERY FS (CUSTOM PROCEDURE TRAY) ×3 IMPLANT
PAD CAST 4YDX4 CTTN HI CHSV (CAST SUPPLIES) ×1 IMPLANT
PADDING CAST COTTON 4X4 STRL (CAST SUPPLIES) ×3
PENCIL SMOKE EVACUATOR (MISCELLANEOUS) ×3 IMPLANT
SCREW CORT TI DBL LEAD 5X36 (Screw) ×2 IMPLANT
SCREW CORT TI DBL LEAD 5X38 (Screw) ×2 IMPLANT
SCREW CORT TI DBL LEAD 5X40 (Screw) ×4 IMPLANT
SCREW CORT TI DBL LEAD 5X46 (Screw) ×2 IMPLANT
SLEEVE SCD COMPRESS KNEE MED (MISCELLANEOUS) ×3 IMPLANT
SPONGE LAP 18X18 RF (DISPOSABLE) ×3 IMPLANT
SUCTION FRAZIER HANDLE 10FR (MISCELLANEOUS) ×2
SUCTION TUBE FRAZIER 10FR DISP (MISCELLANEOUS) IMPLANT
SUT MNCRL AB 4-0 PS2 18 (SUTURE) ×5 IMPLANT
SUT MON AB 2-0 CT1 36 (SUTURE) ×1 IMPLANT
SUT VIC AB 0 CT1 27 (SUTURE) ×3
SUT VIC AB 0 CT1 27XBRD ANBCTR (SUTURE) ×1 IMPLANT
SUT VIC AB 3-0 SH 27 (SUTURE) ×3
SUT VIC AB 3-0 SH 27X BRD (SUTURE) IMPLANT
SYR BULB 3OZ (MISCELLANEOUS) ×2 IMPLANT
SYR CONTROL 10ML LL (SYRINGE) IMPLANT
TOWEL GREEN STERILE FF (TOWEL DISPOSABLE) ×6 IMPLANT
TUBE CONNECTING 20'X1/4 (TUBING)
TUBE CONNECTING 20X1/4 (TUBING) IMPLANT
TUBE SUCTION HIGH CAP CLEAR NV (SUCTIONS) ×2 IMPLANT
UNDERPAD 30X36 HEAVY ABSORB (UNDERPADS AND DIAPERS) ×1 IMPLANT

## 2019-12-13 NOTE — Anesthesia Preprocedure Evaluation (Signed)
Anesthesia Evaluation  Patient identified by MRN, date of birth, ID band Patient awake    Reviewed: Allergy & Precautions, NPO status , Patient's Chart, lab work & pertinent test results  Airway Mallampati: I  TM Distance: >3 FB Neck ROM: Full    Dental   Pulmonary Current Smoker and Patient abstained from smoking.,    Pulmonary exam normal        Cardiovascular Normal cardiovascular exam     Neuro/Psych    GI/Hepatic GERD  Medicated and Controlled,  Endo/Other    Renal/GU      Musculoskeletal   Abdominal   Peds  Hematology   Anesthesia Other Findings   Reproductive/Obstetrics                             Anesthesia Physical Anesthesia Plan  ASA: II  Anesthesia Plan: General   Post-op Pain Management:    Induction: Intravenous  PONV Risk Score and Plan: 1 and Ondansetron  Airway Management Planned: LMA  Additional Equipment:   Intra-op Plan:   Post-operative Plan: Extubation in OR  Informed Consent: I have reviewed the patients History and Physical, chart, labs and discussed the procedure including the risks, benefits and alternatives for the proposed anesthesia with the patient or authorized representative who has indicated his/her understanding and acceptance.       Plan Discussed with: CRNA and Surgeon  Anesthesia Plan Comments:         Anesthesia Quick Evaluation

## 2019-12-13 NOTE — Anesthesia Postprocedure Evaluation (Signed)
Anesthesia Post Note  Patient: Roger Haney  Procedure(s) Performed: INTRAMEDULLARY (IM) NAIL TIBIAL (Left Leg Lower)     Patient location during evaluation: PACU Anesthesia Type: General Level of consciousness: awake and alert Pain management: pain level controlled Vital Signs Assessment: post-procedure vital signs reviewed and stable Respiratory status: spontaneous breathing, nonlabored ventilation, respiratory function stable and patient connected to nasal cannula oxygen Cardiovascular status: blood pressure returned to baseline and stable Postop Assessment: no apparent nausea or vomiting Anesthetic complications: no    Last Vitals:  Vitals:   12/13/19 1515 12/13/19 1530  BP: (!) 142/96 (!) 151/99  Pulse:  78  Resp: 17 10  SpO2: 99% 99%    Last Pain:  Vitals:   12/13/19 1530  PainSc: 6                  [name]

## 2019-12-13 NOTE — Discharge Instructions (Addendum)
NO TYLENOL PRODUCTS UNTIL  9: 50 PM   WAIT TO START CELEBREX  IN AM   OXYCODONE  5 MG GIVEN AT  3:15 PM   Post Anesthesia Home Care Instructions  Activity: Get plenty of rest for the remainder of the day. A responsible individual must stay with you for 24 hours following the procedure.  For the next 24 hours, DO NOT: -Drive a car -Advertising copywriter -Drink alcoholic beverages -Take any medication unless instructed by your physician -Make any legal decisions or sign important papers.  Meals: Start with liquid foods such as gelatin or soup. Progress to regular foods as tolerated. Avoid greasy, spicy, heavy foods. If nausea and/or vomiting occur, drink only clear liquids until the nausea and/or vomiting subsides. Call your physician if vomiting continues.  Special Instructions/Symptoms: Your throat may feel dry or sore from the anesthesia or the breathing tube placed in your throat during surgery. If this causes discomfort, gargle with warm salt water. The discomfort should disappear within 24 hours.  If you had a scopolamine patch placed behind your ear for the management of post- operative nausea and/or vomiting:  1. The medication in the patch is effective for 72 hours, after which it should be removed.  Wrap patch in a tissue and discard in the trash. Wash hands thoroughly with soap and water. 2. You may remove the patch earlier than 72 hours if you experience unpleasant side effects which may include dry mouth, dizziness or visual disturbances. 3. Avoid touching the patch. Wash your hands with soap and water after contact with the patch.

## 2019-12-13 NOTE — Interval H&P Note (Signed)
History and Physical Interval Note:  12/13/2019 10:41 AM  Roger Haney  has presented today for surgery, with the diagnosis of LEFT TIBIA FRACTURE.  The various methods of treatment have been discussed with the patient and family. After consideration of risks, benefits and other options for treatment, the patient has consented to  Procedure(s) with comments: INTRAMEDULLARY (IM) NAIL TIBIAL (Left) - NO BLOCK as a surgical intervention.  The patient's history has been reviewed, patient examined, no change in status, stable for surgery.  I have reviewed the patient's chart and labs.  Questions were answered to the patient's satisfaction.     [name]

## 2019-12-13 NOTE — Transfer of Care (Signed)
Immediate Anesthesia Transfer of Care Note  Patient: PARNELL SPIELER  Procedure(s) Performed: INTRAMEDULLARY (IM) NAIL TIBIAL (Left Leg Lower)  Patient Location: PACU  Anesthesia Type:General  Level of Consciousness: awake  Airway & Oxygen Therapy: Patient Spontanous Breathing and Patient connected to face mask oxygen  Post-op Assessment: Report given to RN and Post -op Vital signs reviewed and stable  Post vital signs: Reviewed and stable  Last Vitals:  Vitals Value Taken Time  BP    Temp    Pulse    Resp    SpO2      Last Pain:  Vitals:   12/13/19 1052  PainSc: 6       Patients Stated Pain Goal: 4 (12/13/19 1052)  Complications: No apparent anesthesia complications

## 2019-12-13 NOTE — Op Note (Signed)
Orthopaedic Surgery Operative Note (CSN: [id number])  Roger Haney  [birth year] Date of Surgery: 12/13/2019   Diagnoses:  LEFT TIBIA FRACTURE  Procedure: Left tibial IM nail   Operative Finding Successful completion of the planned procedure.  Good bone quality the patient is a smoker.  We had robust cortical apposition and good placement with our 10.5 mm Phoenix nail.  Post-operative plan: The patient will be touchdown weightbearing in a boot with bone stimulator start soon as he receives it.  The patient will be discharged home.  DVT prophylaxis Aspirin 81 mg twice daily for 6 weeks.   Pain control with PRN pain medication preferring oral medicines.  Follow up plan will be scheduled in approximately 7 days for incision check and XR.  Post-Op Diagnosis: Same Surgeons:Primary: Hiram Gash, MD Assistants:Caroline McBane PA-C Location: [facility] OR ROOM 6 Anesthesia: General with local anesthesia Antibiotics: Ancef 2 g with local vancomycin powder 1 g at the surgical site Tourniquet time: * No tourniquets in log * Estimated Blood Loss: Minimal Complications: None Specimens: None Implants: Implant Name Type Inv. Item Serial No. Manufacturer Lot No. LRB No. Used Action  Phoenix Tibial Nail System    [employer] [id number] Left 1 Implanted  SCREW CORT TI DBL LEAD 5X40 - [id number] Screw SCREW CORT TI DBL LEAD 5X40  [employer](ORTH,TRAU,BIO,SG) [id number] Left 1 Implanted  Ti-Double Lead Cortical Screw    [employer] [id number] Left 1 Implanted  Ti-Double Lead Cortical Screw    [employer] [id number] Left 1 Implanted  SCREW CORT TI DBL LEAD 5X40 - [id number] Screw SCREW CORT TI DBL LEAD 5X40  [employer](ORTH,TRAU,BIO,SG) [id number] Left 1 Implanted    Indications for Surgery:   Roger Haney is a 44 y.o. male with fall resulting in a displaced spiral left tibial shaft fracture.  Benefits and risks of operative and nonoperative management were discussed prior to surgery  with patient/guardian(s) and informed consent form was completed.  Specific risks including infection, need for additional surgery, high risk of nonunion, malunion, hardware prominence and pain as well as knee stiffness.   Procedure:   The patient was identified properly. Informed consent was obtained and the surgical site was marked. The patient was taken up to suite where general anesthesia was induced.  The patient was positioned supine on a radiolucent table with the leg over a bone foam.  The left tibia was prepped and draped in the usual sterile fashion.  Timeout was performed before the beginning of the case.  We began with a lateral parapatellar approach to the proximal tibia.  Incision was made midline overlying the distal one half of the patella as well as the proximal aspect of the patellar tendon.  We dissected down to layer 1 sharply and then raised thick skin flaps.  We are then able to move the tissue laterally and expose the lateral aspect of the patella and the patellar tendon.  Retinaculum was incised sharply taking care to avoid involvement of the capsule thus we did not become intra-articular.  We then were able to sharply release the lateral retinaculum for later closure to allow the patella to translate medially for the semi-extended nailing position.   Once this was performed we then began with our approach to the proximal must with a tibia and placed a guidewire on orthogonal views at the medial aspect of the lateral tibial eminence.  This was placed just off the anterior lip of the tibia as is  typical for starting point for tibial nail.  This was then advanced on orthogonal views and an entry reamer was used to open the tibial canal.   Once this was performed were able to advance a ball-tipped guidewire down the length of the tibia and held the fracture reduced while the wire was passed across the fracture site itself.  This was passed to the level of the distal physeal scar.  This  point we obtained a measurement using fluoroscopy to guide her management.  We measured for a 33cm nail.  We then began with reaming.  Sequentially reamed up from an 8 mm size to a 58mm size containing chatter with our largest reamers.  We selected a 10.19mm nail based on this.  Fracture was held reduced throughout the reaming process.   This point a [employer]omet phoenix 10.71mm x 33 cm and placed under orthogonal fluoroscopic images to the appropriate position.  Were happy with our length rotation and alignment and placed 2 proximal interlocks as well as 2 distal interlocks with the distal interlocks being placed through perfect circle technique.   Final assessment of rotation and alignment was appropriate and we are satisfied with final fluoroscopic images.  We irrigated the wound copiously before placing local antibiotic as listed above.  Close the incision in a multilayer fashion with absorbable suture.  Sterile dressing was placed along with a boot.  Patient was awoken taken to PACU in stable condition.  Alfonse Alpers, PA-C, present and scrubbed throughout the case, critical for completion in a timely fashion, and for retraction, instrumentation, closure.

## 2019-12-13 NOTE — Anesthesia Procedure Notes (Signed)
Procedure Name: LMA Insertion Date/Time: 12/13/2019 11:47 AM Performed by: Caren Macadam, CRNA Pre-anesthesia Checklist: Patient identified, Emergency Drugs available, Suction available and Patient being monitored Patient Re-evaluated:Patient Re-evaluated prior to induction Oxygen Delivery Method: Circle system utilized Preoxygenation: Pre-oxygenation with 100% oxygen Induction Type: IV induction Ventilation: Mask ventilation without difficulty LMA: LMA inserted LMA Size: 4.0 Number of attempts: 1 Placement Confirmation: positive ETCO2 and breath sounds checked- equal and bilateral Tube secured with: Tape Dental Injury: Teeth and Oropharynx as per pre-operative assessment

## 2019-12-14 ENCOUNTER — Encounter: Payer: Self-pay | Admitting: *Deleted

## 2019-12-14 NOTE — Discharge Summary (Signed)
Patient ID: Roger Haney MRN: [id number] DOB/AGE: Nov 05, 1975 44 y.o.  Admit date: 12/13/2019 Discharge date: 12/13/2019  Admission Diagnoses: left tibia fracture  Discharge Diagnoses: Left tibia fracture Active Problems:   * No active hospital problems. *   Past Medical History:  Diagnosis Date  . Arthritis   . GERD (gastroesophageal reflux disease)   . H/O seasonal allergies     Procedures Performed: Left tibial IM nail  Discharged Condition: good/stable  Hospital Course: Patient brought in as an outpatient for surgery. He tolerated the procedure well. He was kept for monitoring in PACU for pain control and medical monitoring postop and was found to be stable for DC home the evening after surgery. Patient agreed with decision to discharge home as he did not want to stay overnight in the hospital. Patient was instructed on specific activity restrictions and all questions were answered. He was instructed of any concerning symptoms he should seek immediate evaluation for.   Consults: None  Significant Diagnostic Studies: No additional pertinent studies  Treatments: Surgery - Left tibial IM nail   Disposition: Discharge disposition: 01-Home or Self Care     Follow-up: 1 week in office for incision check and x-ray Medications: sent electronically to patient's pharmacy  Discharge Instructions    Call MD for:  redness, tenderness, or signs of infection (pain, swelling, redness, odor or green/yellow discharge around incision site)   Complete by: As directed    Call MD for:  severe uncontrolled pain   Complete by: As directed    Call MD for:  temperature >100.4   Complete by: As directed    Diet - low sodium heart healthy   Complete by: As directed    Discharge instructions   Complete by: As directed    Ramond Marrow MD, MPH Alfonse Alpers, PA-C [facility] [address] [phone] (tel)   [phone] (fax)   POST-OPERATIVE  INSTRUCTIONS  WOUND CARE - You may remove the Operative Dressing on Post-Op Day #3 (72hrs after surgery).  Alternatively if you would like you can leave dressing on until follow-up if within 7-8 days but keep it dry. - Leave steri-strips in place until they fall off on their own, usually 2 weeks postop. - An ACE wrap may be used to control swelling, do not wrap this too tight.  If the initial ACE wrap feels too tight you may loosen it. - There may be a small amount of fluid/bleeding leaking at the surgical site. You may change/reinforce the bandage as needed.  - Use the Cryocuff or Ice as often as possible for the first 7 days, then as needed for pain relief. Always keep a towel, ACE wrap or other barrier between the cooling unit and your skin.  - You may shower on Post-Op Day #3. Gently pat the area dry.  -  Do not soak the knee in water or submerge it. Do not go swimming in the pool or ocean until 4 weeks after surgery or when otherwise instructed.  Keep dry incisions as dry as possible.   BRACE/AMBULATION -  You will be placed in a boot post-operatively. You may remove for hygiene. -            Use crutches to help you ambulate -            Do NOT put any weight on your operative leg  REGIONAL ANESTHESIA (NERVE BLOCKS) - The anesthesia team may have performed a nerve block  for you if safe in the setting of your care.  This is a great tool used to minimize pain.  Typically the block may start wearing off overnight.  This can be a challenging period but please utilize your as needed pain medications to try and manage this period and know it will be a brief transition as the nerve block wears completely   POST-OP MEDICATIONS - Multimodal approach to pain control - In general your pain will be controlled with a combination of substances.  Prescriptions unless otherwise discussed are electronically sent to your pharmacy.  This is a carefully made plan we use to minimize narcotic use.     - Meloxicam OR Celebrex - Anti-inflammatory medication taken on a scheduled basis - Acetaminophen - Non-narcotic pain medicine taken on a scheduled basis  - Oxycodone - This is a strong narcotic, to be used only on an ?oas needed?  basis for pain. - Aspirin 81mg  - This medicine is used to minimize the risk of blood clots after surgery. - Zofran - take as needed for nausea  FOLLOW-UP   Please call the office to schedule a follow-up appointment for your incision check, 7-10 days post-operatively.  IF YOU HAVE ANY QUESTIONS, PLEASE FEEL FREE TO CALL OUR OFFICE.  HELPFUL INFORMATION  - If you had a block, it will wear off between 8-24 hrs postop typically.  This is period when your pain may go from nearly zero to the pain you would have had post-op without the block.  This is an abrupt transition but nothing dangerous is happening.  You may take an extra dose of narcotic when this happens.   Keep your leg elevated to decrease swelling, which will then in turn decrease your pain. I would elevate the foot of your bed by putting a couple of couch pillows between your mattress and box spring. I would not keep pillow directly under your ankle.  - Do not sleep with a pillow behind your knee even if it is more comfortable as this may make it harder to get your knee fully straight long term.   There will be MORE swelling on days 1-3 than there is on the day of surgery.  This also is normal. The swelling will decrease with the anti-inflammatory medication, ice and keeping it elevated. The swelling will make it more difficult to bend your knee. As the swelling goes down your motion will become easier   There may be some numbness adjacent to the incision site. This may last for 6-12 months or longer in some patients and is expected.   You may return to sedentary work/school in the next couple of days when you feel up to it. You will need to keep your leg elevated as much as possible    You should  wean off your narcotic medicines as soon as you are able.  Most patients will be off or using minimal narcotics before their first postop appointment.    We suggest you use the pain medication the first night prior to going to bed, in order to ease any pain when the anesthesia wears off. You should avoid taking pain medications on an empty stomach as it will make you nauseous.   Do not drink alcoholic beverages or take illicit drugs when taking pain medications.   It is against the law to drive while taking narcotics. You cannot drive if your Right leg is in brace locked in extension.   Pain medication may make you constipated.  Below are a few solutions to try in this order:  o Decrease the amount of pain medication if you aren't having pain.  o Drink lots of decaffeinated fluids.  o Drink prune juice and/or each dried prunes   o If the first 3 don't work start with additional solutions  o Take Colace - an over-the-counter stool softener  o Take Senokot - an over-the-counter laxative  o Take Miralax - a stronger over-the-counter laxative     Allergies as of 12/13/2019   No Known Allergies     Medication List    STOP taking these medications   HYDROcodone-acetaminophen 5-325 MG tablet Commonly known as: NORCO/VICODIN     TAKE these medications   acetaminophen 500 MG tablet Commonly known as: TYLENOL Take 2 tablets (1,000 mg total) by mouth every 8 (eight) hours for 14 days.   aspirin 81 MG chewable tablet Commonly known as: Aspirin Childrens Chew 1 tablet (81 mg total) by mouth 2 (two) times daily.   celecoxib 100 MG capsule Commonly known as: CeleBREX Take 1 capsule (100 mg total) by mouth 2 (two) times daily.   fexofenadine 60 MG tablet Commonly known as: ALLEGRA Take 60 mg by mouth 2 (two) times daily.   fluticasone 50 MCG/ACT nasal spray Commonly known as: FLONASE Place 1 spray daily as needed into the nose for congestion.   multivitamin tablet Take 1 tablet  by mouth daily.   omeprazole 40 MG capsule Commonly known as: PRILOSEC Take 40 mg daily by mouth.   ondansetron 4 MG tablet Commonly known as: Zofran Take 1 tablet (4 mg total) by mouth every 8 (eight) hours as needed for up to 7 days for nausea or vomiting.   oxyCODONE 5 MG immediate release tablet Commonly known as: Oxy IR/ROXICODONE Take 1-2 pills every 6 hrs as needed for pain, no more than 6 per day

## 2021-10-07 ENCOUNTER — Emergency Department (HOSPITAL_BASED_OUTPATIENT_CLINIC_OR_DEPARTMENT_OTHER): Payer: BC Managed Care – PPO

## 2021-10-07 ENCOUNTER — Emergency Department (HOSPITAL_BASED_OUTPATIENT_CLINIC_OR_DEPARTMENT_OTHER)
Admission: EM | Admit: 2021-10-07 | Discharge: 2021-10-07 | Disposition: A | Discharge status: Home / Self Care | Payer: BC Managed Care – PPO | Attending: Emergency Medicine | Admitting: Emergency Medicine

## 2021-10-07 ENCOUNTER — Other Ambulatory Visit: Payer: Self-pay

## 2021-10-07 ENCOUNTER — Encounter (HOSPITAL_BASED_OUTPATIENT_CLINIC_OR_DEPARTMENT_OTHER): Payer: Self-pay

## 2021-10-07 DIAGNOSIS — F17210 Nicotine dependence, cigarettes, uncomplicated: Secondary | ICD-10-CM | POA: Insufficient documentation

## 2021-10-07 DIAGNOSIS — Z20822 Contact with and (suspected) exposure to covid-19: Secondary | ICD-10-CM | POA: Insufficient documentation

## 2021-10-07 DIAGNOSIS — J189 Pneumonia, unspecified organism: Secondary | ICD-10-CM

## 2021-10-07 DIAGNOSIS — J101 Influenza due to other identified influenza virus with other respiratory manifestations: Secondary | ICD-10-CM

## 2021-10-07 DIAGNOSIS — J181 Lobar pneumonia, unspecified organism: Secondary | ICD-10-CM | POA: Insufficient documentation

## 2021-10-07 DIAGNOSIS — R079 Chest pain, unspecified: Secondary | ICD-10-CM

## 2021-10-07 DIAGNOSIS — R0789 Other chest pain: Secondary | ICD-10-CM | POA: Diagnosis present

## 2021-10-07 LAB — RESP PANEL BY RT-PCR (FLU A&B, COVID) ARPGX2
Influenza A by PCR: POSITIVE — AB
Influenza B by PCR: NEGATIVE
SARS Coronavirus 2 by RT PCR: NEGATIVE

## 2021-10-07 LAB — CBC
HCT: 44.6 % (ref 39.0–52.0)
Hemoglobin: 15.6 g/dL (ref 13.0–17.0)
MCH: 33.1 pg (ref 26.0–34.0)
MCHC: 35 g/dL (ref 30.0–36.0)
MCV: 94.5 fL (ref 80.0–100.0)
Platelets: 242 10*3/uL (ref 150–400)
RBC: 4.72 MIL/uL (ref 4.22–5.81)
RDW: 13 % (ref 11.5–15.5)
WBC: 11.2 10*3/uL — ABNORMAL HIGH (ref 4.0–10.5)
nRBC: 0 % (ref 0.0–0.2)

## 2021-10-07 LAB — BASIC METABOLIC PANEL
Anion gap: 11 (ref 5–15)
BUN: 10 mg/dL (ref 6–20)
CO2: 23 mmol/L (ref 22–32)
Calcium: 8.8 mg/dL — ABNORMAL LOW (ref 8.9–10.3)
Chloride: 103 mmol/L (ref 98–111)
Creatinine, Ser: 0.9 mg/dL (ref 0.61–1.24)
GFR, Estimated: >60 mL/min (ref >60)
Glucose, Bld: 100 mg/dL — ABNORMAL HIGH (ref 70–99)
Potassium: 3.6 mmol/L (ref 3.5–5.1)
Sodium: 137 mmol/L (ref 135–145)

## 2021-10-07 LAB — D-DIMER, QUANTITATIVE: D-Dimer, Quant: 1.6 ug/mL-FEU — ABNORMAL HIGH (ref 0.00–0.50)

## 2021-10-07 LAB — TROPONIN I (HIGH SENSITIVITY)
Troponin I (High Sensitivity): 3 ng/L (ref <18)
Troponin I (High Sensitivity): 3 ng/L (ref <18)

## 2021-10-07 MED ORDER — AZITHROMYCIN 250 MG PO TABS: 250.0000 mg | ORAL_TABLET | Freq: Every day | ORAL | 0 refills | Status: DC

## 2021-10-07 MED ORDER — IOHEXOL 350 MG/ML SOLN
100.0000 mL | Freq: Once | INTRAVENOUS | Status: AC | PRN
  Administered 2021-10-07: 75 mL via INTRAVENOUS

## 2021-10-07 MED ORDER — OSELTAMIVIR PHOSPHATE 75 MG PO CAPS: 75.0000 mg | ORAL_CAPSULE | Freq: Two times a day (BID) | ORAL | 0 refills | Status: DC

## 2021-10-07 MED ORDER — HYDROCOD POLST-CPM POLST ER 10-8 MG/5ML PO SUER: 5.0000 mL | Freq: Every evening | ORAL | 0 refills | Status: DC | PRN

## 2021-10-07 NOTE — ED Provider Notes (Signed)
[facility] Provider Note   CSN: [id number] Arrival date & time: 10/07/21  [id number]     History Chief Complaint  Patient presents with   Chest Pain    Roger Haney is a 45 y.o. male.  He is here with a complaint of chest pain cough runny nose that started about 10 days ago.  Cough productive of some clear mucus.  The last few days he has been complaining of left-sided chest pain 3 out of 10 worse with cough, 8 out of 10.  Sharp stabbing.  He self medicated with a prednisone taper and amoxicillin without improvement.  Smoker.  No nausea vomiting diarrhea or urinary symptoms.  No clear fever.  The history is provided by the patient.  Chest Pain Pain location:  L chest Pain quality: dull and stabbing   Pain radiates to:  L arm Pain severity:  Severe Onset quality:  Gradual Duration:  3 days Timing:  Constant Progression:  Unchanged Chronicity:  New Context: breathing   Context comment:  Coughing Relieved by:  None tried Worsened by:  Coughing Ineffective treatments:  None tried Associated symptoms: cough and shortness of breath   Associated symptoms: no abdominal pain, no back pain, no diaphoresis, no dysphagia, no fever, no headache, no nausea and no vomiting   Risk factors: smoking       Past Medical History:  Diagnosis Date   Arthritis    GERD (gastroesophageal reflux disease)    H/O seasonal allergies     There are no problems to display for this patient.   Past Surgical History:  Procedure Laterality Date   BILATERAL INTRAMEDULLARY (IM) NAIL TIBIAL Left 12/13/2019   Procedure: INTRAMEDULLARY (IM) NAIL TIBIAL;  Surgeon: Hiram Gash, MD;  Location: [facility];  Service: Orthopedics;  Laterality: Left;  NO BLOCK   BOWEL RESECTION     HERNIA REPAIR     x2   KNEE ARTHROSCOPY         History reviewed. No pertinent family history.  Social History   Tobacco Use   Smoking status: Every Day    Packs/day: 1.00     Types: Cigarettes   Smokeless tobacco: Never  Vaping Use   Vaping Use: Never used  Substance Use Topics   Alcohol use: Yes    Comment: occ   Drug use: No    Home Medications Prior to Admission medications   Medication Sig Start Date End Date Taking? Authorizing Provider  fexofenadine (ALLEGRA) 60 MG tablet Take 60 mg by mouth 2 (two) times daily.    [provider]  fluticasone (FLONASE) 50 MCG/ACT nasal spray Place 1 spray daily as needed into the nose for congestion. 07/13/17   [provider]  Multiple Vitamin (MULTIVITAMIN) tablet Take 1 tablet by mouth daily.    [provider]  omeprazole (PRILOSEC) 40 MG capsule Take 40 mg daily by mouth.    [provider]    Allergies    Cat hair extract and Other  Review of Systems   Review of Systems  Constitutional:  Negative for diaphoresis and fever.  HENT:  Positive for rhinorrhea. Negative for sore throat and trouble swallowing.   Eyes:  Negative for visual disturbance.  Respiratory:  Positive for cough and shortness of breath.   Cardiovascular:  Positive for chest pain.  Gastrointestinal:  Negative for abdominal pain, nausea and vomiting.  Genitourinary:  Negative for dysuria.  Musculoskeletal:  Negative for back pain.  Skin:  Negative for rash.  Neurological:  Negative for headaches.   Physical Exam Updated Vital Signs BP (!) 156/107 (BP Location: Right Arm)   Pulse 85   Temp 98 F (36.7 C) (Oral)   Resp 20   Ht 5\' 8"  (1.727 m)   Wt 77.1 kg   SpO2 99%   BMI 25.85 kg/m   Physical Exam Vitals and nursing note reviewed.  Constitutional:      General: He is not in acute distress.    Appearance: He is well-developed.  HENT:     Head: Normocephalic and atraumatic.  Eyes:     Conjunctiva/sclera: Conjunctivae normal.  Cardiovascular:     Rate and Rhythm: Normal rate and regular rhythm.     Heart sounds: Normal heart sounds. No murmur heard. Pulmonary:     Effort: Pulmonary  effort is normal. No respiratory distress.     Breath sounds: Examination of the left-upper field reveals wheezing. Wheezing (Few scattered) present.  Abdominal:     Palpations: Abdomen is soft.     Tenderness: There is no abdominal tenderness. There is no guarding or rebound.  Musculoskeletal:        General: No swelling. Normal range of motion.     Cervical back: Neck supple.     Right lower leg: No tenderness. No edema.     Left lower leg: No tenderness. No edema.  Skin:    General: Skin is warm and dry.     Capillary Refill: Capillary refill takes less than 2 seconds.  Neurological:     Mental Status: He is alert.  Psychiatric:        Mood and Affect: Mood normal.    ED Results / Procedures / Treatments   Labs (all labs ordered are listed, but only abnormal results are displayed) Labs Reviewed  RESP PANEL BY RT-PCR (FLU A&B, COVID) ARPGX2 - Abnormal; Notable for the following components:      Result Value   Influenza A by PCR POSITIVE (*)    All other components within normal limits  BASIC METABOLIC PANEL - Abnormal; Notable for the following components:   Glucose, Bld 100 (*)    Calcium 8.8 (*)    All other components within normal limits  CBC - Abnormal; Notable for the following components:   WBC 11.2 (*)    All other components within normal limits  D-DIMER, QUANTITATIVE - Abnormal; Notable for the following components:   D-Dimer, Quant 1.60 (*)    All other components within normal limits  TROPONIN I (HIGH SENSITIVITY)  TROPONIN I (HIGH SENSITIVITY)    EKG EKG Interpretation  Date/Time:  Tuesday October 07 2021 09:21:53 EST Ventricular Rate:  86 PR Interval:  122 QRS Duration: 108 QT Interval:  365 QTC Calculation: 437 R Axis:   63 Text Interpretation: Sinus rhythm No old tracing to compare Confirmed by [id number] [phone]) on 10/07/2021 9:27:25 AM  Radiology CT Angio Chest PE W/Cm &/Or Wo Cm  Result Date: 10/07/2021 CLINICAL DATA:  Left-sided chest  pain, shortness of breath, positive D-dimer EXAM: CT ANGIOGRAPHY CHEST WITH CONTRAST TECHNIQUE: Multidetector CT imaging of the chest was performed using the standard protocol during bolus administration of intravenous contrast. Multiplanar CT image reconstructions and MIPs were obtained to evaluate the vascular anatomy. CONTRAST:  56mL OMNIPAQUE IOHEXOL 350 MG/ML SOLN COMPARISON:  Same-day chest radiograph, CT chest 09/20/2017 FINDINGS: Cardiovascular: There is adequate opacification of the pulmonary arteries to the segmental level. There is suboptimal opacification of the distal airways.  There is no evidence of pulmonary embolism. The thoracic aorta is normal in appearance. The heart is not enlarged. There is no pericardial effusion. Mediastinum/Nodes: The imaged thyroid is unremarkable. The esophagus is grossly unremarkable. There is no mediastinal or axillary lymphadenopathy. There are prominent bilateral hilar lymph nodes measuring up to 1.3 cm on the right. Lungs/Pleura: Trachea is patent. There is debris in some left lower lobe airways. There is central bronchial wall thickening. There is a background of moderate centrilobular emphysema. There is a thin-walled cyst in the lateral right upper lobe measuring 1.3 cm. There are patchy ground-glass opacities in the left lower lobe greater than expected for dependent atelectasis. Linear opacities in the right middle lobe and lingula likely reflect platelike atelectasis. There is a small left pleural effusion. There is no right effusion. There is no pneumothorax. There is a 4 mm left upper lobe pulmonary nodule, not significantly changed since 2018 (5-35). There are no new or enlarging nodules. Upper Abdomen: The imaged portions of the upper abdominal viscera are unremarkable. Musculoskeletal: There is no acute osseous abnormality or aggressive osseous lesion. Review of the MIP images confirms the above findings. IMPRESSION: 1. No evidence of pulmonary embolism to  the segmental level. 2. Patchy ground-glass opacities in the left lower lobe suspicious for infection/inflammation, possibly aspiration given debris in left lower lobe airways. 3. Bronchial wall thickening consistent with bronchitis. 4. Small left pleural effusion. 5. Mild-to-moderate emphysema (ICD10-J43.9). Electronically Signed   By: Valetta Mole M.D.   On: 10/07/2021 11:30   DG Chest Port 1 View  Result Date: 10/07/2021 CLINICAL DATA:  45 year old male with history of mid left-sided chest pain and shortness of breath since yesterday. EXAM: PORTABLE CHEST 1 VIEW COMPARISON:  No priors. FINDINGS: Lung volumes are low. Diffuse peribronchial cuffing and widespread interstitial prominence, most evident throughout the mid to lower lungs bilaterally. No consolidative airspace disease. No pleural effusions. No pneumothorax. No pulmonary nodule or mass noted. Pulmonary vasculature and the cardiomediastinal silhouette are within normal limits. IMPRESSION: 1. The appearance of the chest may suggest bronchitis. Electronically Signed   By: Vinnie Langton M.D.   On: 10/07/2021 09:52    Procedures Procedures   Medications Ordered in ED Medications  iohexol (OMNIPAQUE) 350 MG/ML injection 100 mL (75 mLs Intravenous Contrast Given 10/07/21 1058)    ED Course  I have reviewed the triage vital signs and the nursing notes.  Pertinent labs & imaging results that were available during my care of the patient were reviewed by me and considered in my medical decision making (see chart for details).  Clinical Course as of 10/07/21 1752  Tue Oct 07, 2021  0947 Chest x-ray without clear infiltrate.  Awaiting radiology reading. [MB]  1041 Patient's D-dimer elevated.  Have put in for CT angio and updated patient. [MB]    Clinical Course User Index [MB] Hayden Rasmussen, MD   MDM Rules/Calculators/A&P                          HEIDI VICENCIO was evaluated in Emergency Department on 10/07/2021 for the symptoms  described in the history of present illness. He was evaluated in the context of the global COVID-19 pandemic, which necessitated consideration that the patient might be at risk for infection with the SARS-CoV-2 virus that causes COVID-19. Institutional protocols and algorithms that pertain to the evaluation of patients at risk for COVID-19 are in a state of rapid change based on information  released by regulatory bodies including the CDC and federal and state organizations. These policies and algorithms were followed during the patient's care in the ED. This patient complains of cough for 1 week, left-sided chest pain worse with cough and breathing; this involves an extensive number of treatment Options and is a complaint that carries with it a high risk of complications and Morbidity. The differential includes pneumonia, pneumothorax, PE, pleurisy, COVID, flu,  I ordered, reviewed and interpreted labs, which included chest x-ray showing mildly elevated white count normal hemoglobin, chemistries unremarkable, COVID-negative flu positive, D-dimer elevated, troponins flat  I ordered imaging studies which included chest x-ray and CT angio and I independently    visualized and interpreted imaging which showed possible left lower lobe infiltrate question aspiration, no PE, small effusion Additional history obtained from patient's wife Previous records obtained and reviewed in epic no recent admissions  After the interventions stated above, I reevaluated the patient and found patient to be oxygenating well and in no respiratory distress.  Likely flu but unclear time of onset.  Wife asking to treat him with Tamiflu as she had great results when she took it for her influenza.  Definitely feel he needs antibiotics for these findings on his chest CT and not sure if he is adequately covered with the amoxicillin he was taking.  Will cover with Zithromax and some cough medicine.  Recommended close follow-up with PCP.   Return instructions discussed   Final Clinical Impression(s) / ED Diagnoses Final diagnoses:  Community acquired pneumonia of left lower lobe of lung  Influenza A    Rx / DC Orders ED Discharge Orders          Ordered    oseltamivir (TAMIFLU) 75 MG capsule  Every 12 hours        10/07/21 1145    azithromycin (ZITHROMAX) 250 MG tablet  Daily        10/07/21 1145    chlorpheniramine-HYDROcodone (TUSSIONEX PENNKINETIC ER) 10-8 MG/5ML SUER  At bedtime PRN        10/07/21 1145             Hayden Rasmussen, MD 10/07/21 1754

## 2021-10-07 NOTE — Discharge Instructions (Addendum)
You are seen in the emergency department for continued cough and left-sided chest pain.  There is no evidence of heart injury.  You had a CAT scan of your chest that showed possible pneumonia and a small pleural effusion.  We are treating you with antibiotics.  You also tested positive for influenza, and we are prescribing Tamiflu which may be helpful.  You should rest and drink plenty of fluids.  Tylenol and ibuprofen for pain.  Follow-up with your primary care doctor.  Included below is the report of the CAT scan.  This will need to be followed up with your primary care doctor. IMPRESSION:  1. No evidence of pulmonary embolism to the segmental level.  2. Patchy ground-glass opacities in the left lower lobe suspicious  for infection/inflammation, possibly aspiration given debris in left  lower lobe airways.  3. Bronchial wall thickening consistent with bronchitis.  4. Small left pleural effusion.  5. Mild-to-moderate emphysema (ICD10-J43.9).

## 2021-10-07 NOTE — ED Triage Notes (Signed)
Pt c/o mid/left sided chest pain & shortness of breath since yesterday. States has had a cough the past few days, pain worse when coughing.

## 2023-02-01 IMAGING — CT CT ANGIO CHEST
2 of 8 series · 18 of 36 positions shown · IV contrast (Omnipaque)
Comparison: Same-day chest radiograph, CT chest 09/20/2017

CLINICAL DATA: Left-sided chest pain, shortness of breath, positive
D-dimer

EXAM:
CT ANGIOGRAPHY CHEST WITH CONTRAST
TECHNIQUE: Multidetector CT imaging of the chest was performed using the
standard protocol during bolus administration of intravenous
contrast. Multiplanar CT image reconstructions and MIPs were
obtained to evaluate the vascular anatomy.
CONTRAST:  75mL OMNIPAQUE IOHEXOL 350 MG/ML SOLN

[Series 6: pe thins · axial · 0.67mm/px · z∈[-290,-10]mm · 17 of 314 slices shown]
[im 17/314  lung]
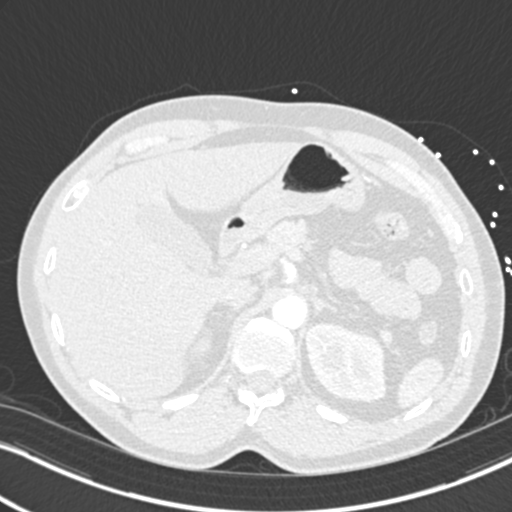
[im 33/314  mediastinal]
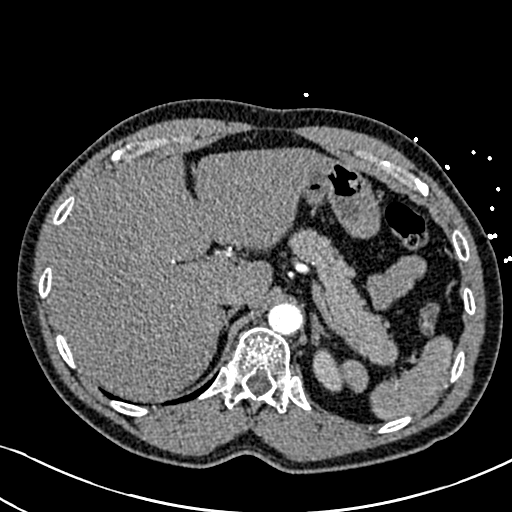
[im 50/314  lung]
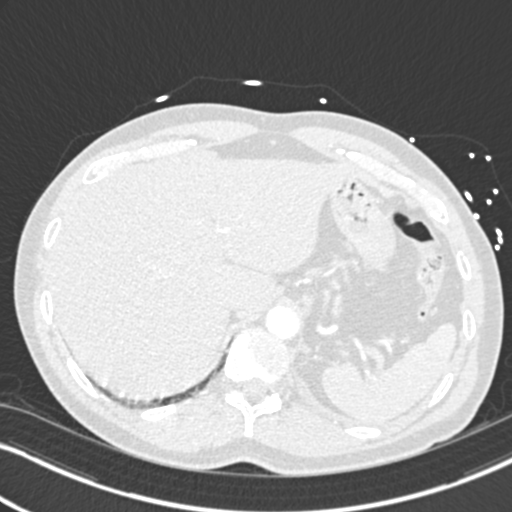
[im 66/314  mediastinal]
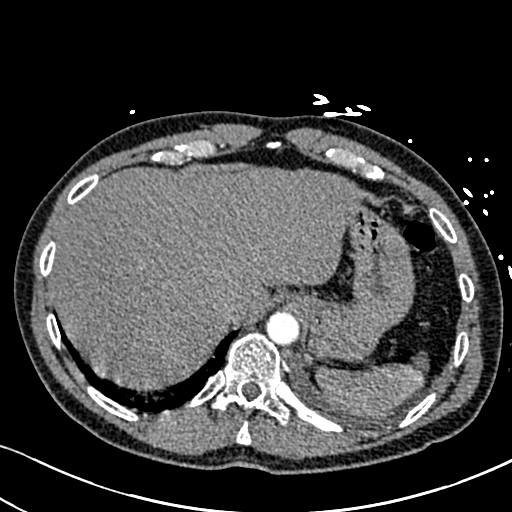
[im 83/314  lung]
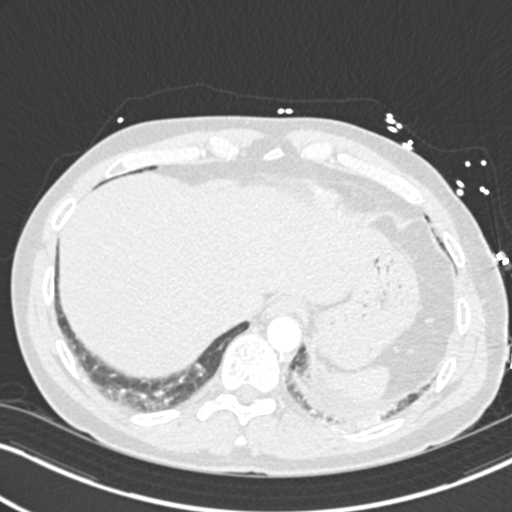
[im 99/314  mediastinal]
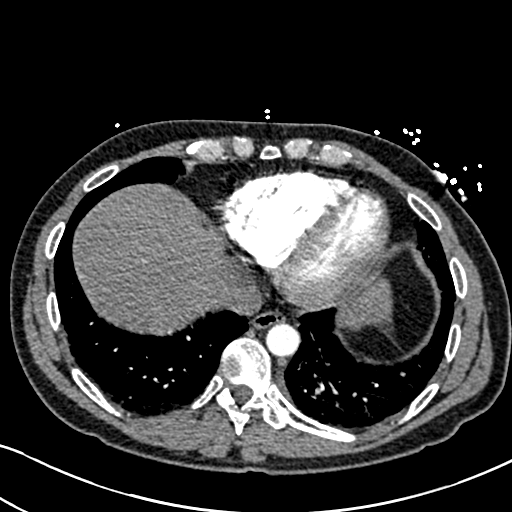
[im 116/314  lung]
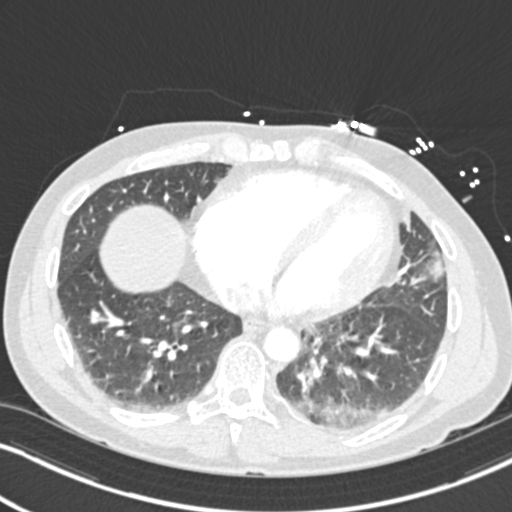
[im 132/314  mediastinal]
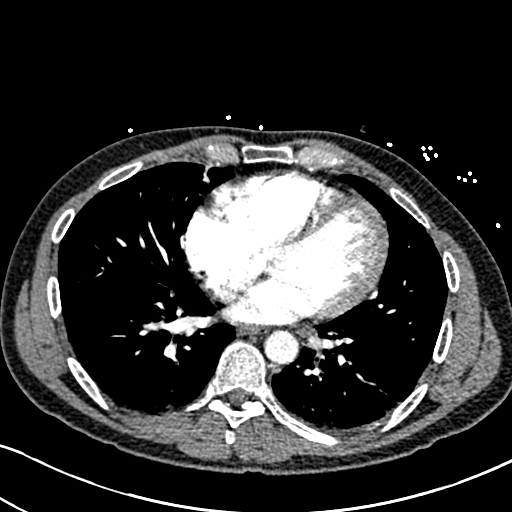
[im 165/314  lung]
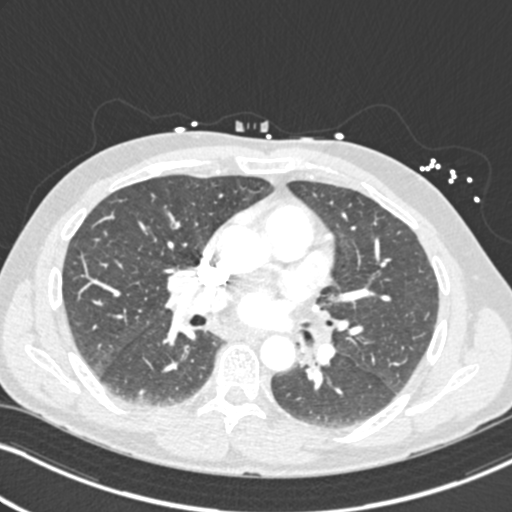
[im 182/314  mediastinal]
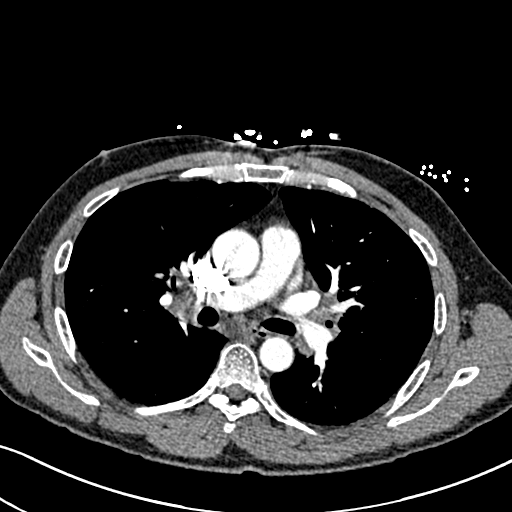
[im 198/314  lung]
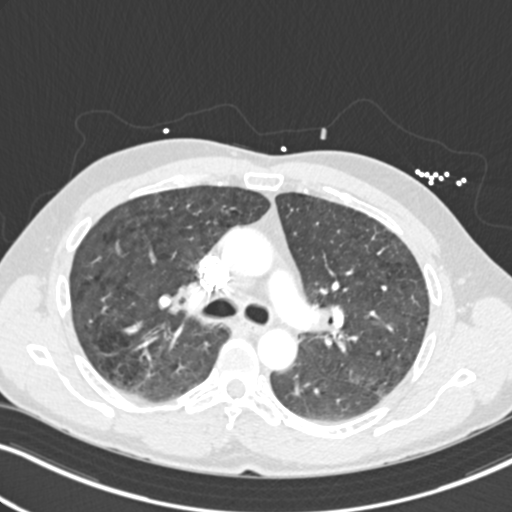
[im 215/314  mediastinal]
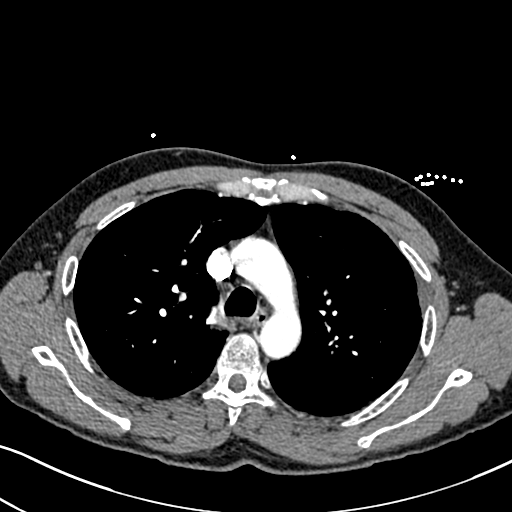
[im 231/314  lung]
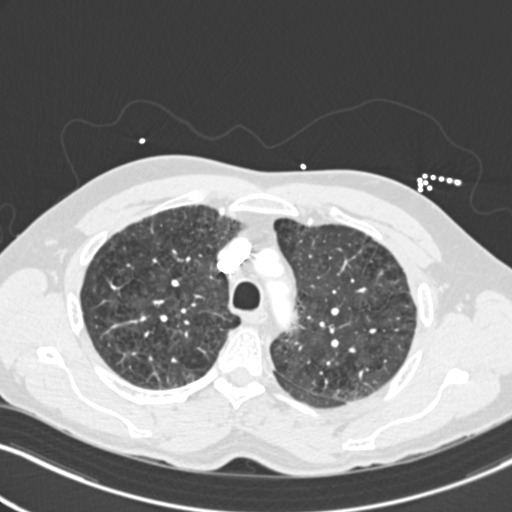
[im 248/314  mediastinal]
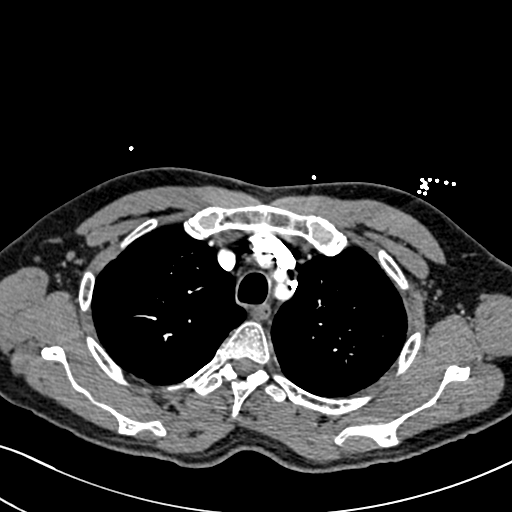
[im 264/314  lung]
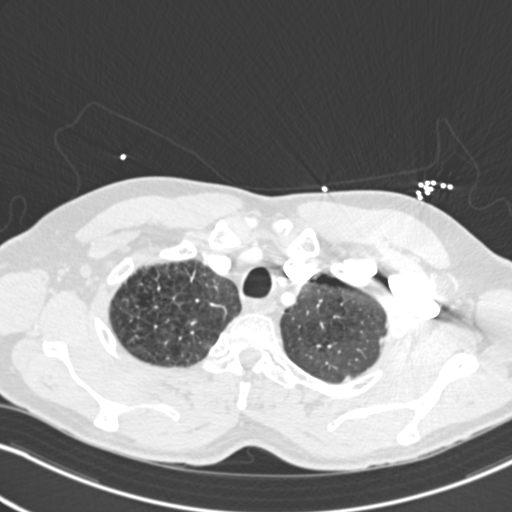
[im 281/314  mediastinal]
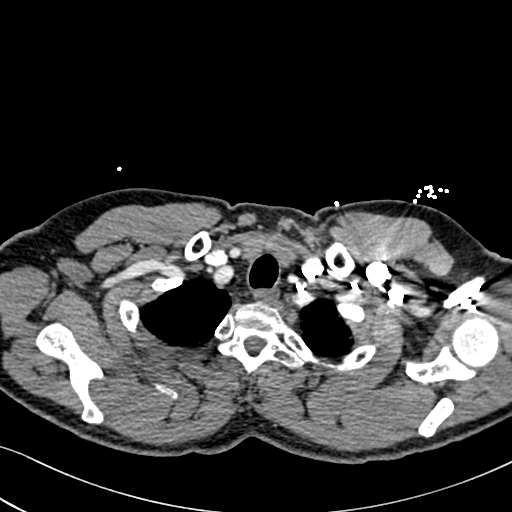
[im 297/314  lung]
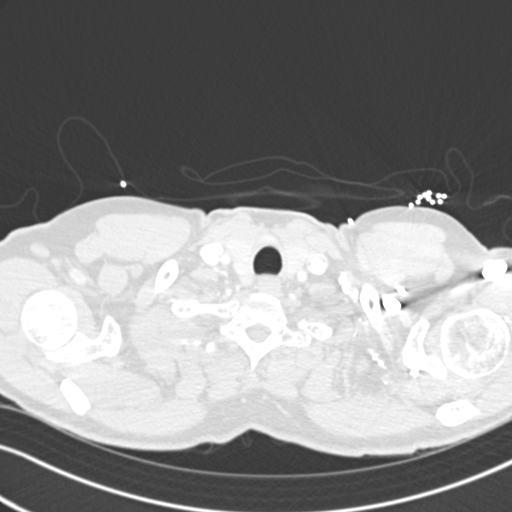

[Series 7: pe coronal mpr · coronal · 0.63mm/px · 1 of 113 slices shown]
[im 57/113  mediastinal]
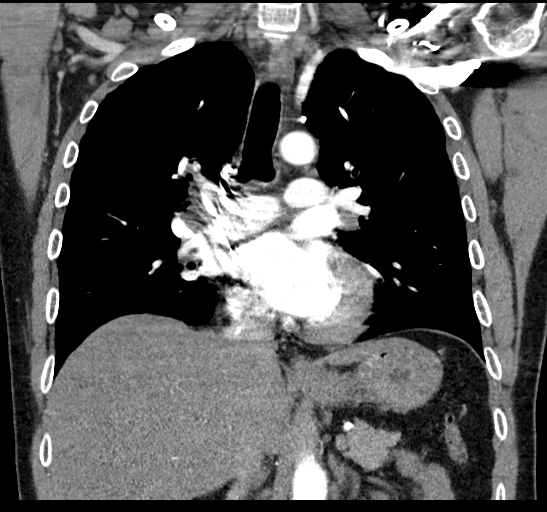

[18 of 36 positions shown; findings below may reference images not displayed]

FINDINGS: Cardiovascular: There is adequate opacification of the pulmonary
arteries to the segmental level. There is suboptimal opacification
of the distal airways. There is no evidence of pulmonary embolism.
The thoracic aorta is normal in appearance. The heart is not
enlarged. There is no pericardial effusion.

Mediastinum/Nodes: The imaged thyroid is unremarkable. The esophagus
is grossly unremarkable. There is no mediastinal or axillary
lymphadenopathy. There are prominent bilateral hilar lymph nodes
measuring up to 1.3 cm on the right.

Lungs/Pleura: Trachea is patent. There is debris in some left lower
lobe airways. There is central bronchial wall thickening.

There is a background of moderate centrilobular emphysema. There is
a thin-walled cyst in the lateral right upper lobe measuring 1.3 cm.
There are patchy ground-glass opacities in the left lower lobe
greater than expected for dependent atelectasis. Linear opacities in
the right middle lobe and lingula likely reflect platelike
atelectasis. There is a small left pleural effusion. There is no
right effusion. There is no pneumothorax.

There is a 4 mm left upper lobe pulmonary nodule, not significantly
changed since [DATE]). There are no new or enlarging nodules.

Upper Abdomen: The imaged portions of the upper abdominal viscera
are unremarkable.

Musculoskeletal: There is no acute osseous abnormality or aggressive
osseous lesion.

Review of the MIP images confirms the above findings.
IMPRESSION: 1. No evidence of pulmonary embolism to the segmental level.
2. Patchy ground-glass opacities in the left lower lobe suspicious
for infection/inflammation, possibly aspiration given debris in left
lower lobe airways.
3. Bronchial wall thickening consistent with bronchitis.
4. Small left pleural effusion.
5. Mild-to-moderate emphysema ([id number].[id number]).

## 2023-06-18 ENCOUNTER — Other Ambulatory Visit: Payer: Self-pay | Admitting: Surgery

## 2023-06-18 DIAGNOSIS — K439 Ventral hernia without obstruction or gangrene: Secondary | ICD-10-CM

## 2023-06-25 ENCOUNTER — Ambulatory Visit
Admission: RE | Admit: 2023-06-25 | Discharge: 2023-06-25 | Disposition: A | Discharge status: Home / Self Care | Payer: BC Managed Care – PPO | Source: Ambulatory Visit | Attending: Surgery | Admitting: Surgery

## 2023-06-25 ENCOUNTER — Other Ambulatory Visit: Payer: BC Managed Care – PPO

## 2023-06-25 DIAGNOSIS — K439 Ventral hernia without obstruction or gangrene: Secondary | ICD-10-CM

## 2023-06-25 MED ORDER — IOPAMIDOL (ISOVUE-300) INJECTION 61%
100.0000 mL | Freq: Once | INTRAVENOUS | Status: AC | PRN
  Administered 2023-06-25: 100 mL via INTRAVENOUS

## 2023-09-07 ENCOUNTER — Ambulatory Visit: Payer: Self-pay | Admitting: Surgery

## 2023-09-07 NOTE — Progress Notes (Signed)
COVID Vaccine received:  []  No [x]  Yes Date of any COVID positive Test in last 90 days: no PCP - [facility] Cardiologist - no  Chest x-ray -  EKG -   Stress Test -  ECHO -  Cardiac Cath -   Bowel Prep - [x]  No  []   Yes ______  Pacemaker / ICD device [x]  No []  Yes   Spinal Cord Stimulator:[x]  No []  Yes       History of Sleep Apnea? [x]  No []  Yes   CPAP used?- [x]  No []  Yes    Does the patient monitor blood sugar?          [x]  No []  Yes  []  N/A  Patient has: [x]  NO Hx DM   []  Pre-DM                 []  DM1  []   DM2 Does patient have a [employer] or Dexacom? []  No []  Yes   Fasting Blood Sugar Ranges-  Checks Blood Sugar _____ times a day  GLP1 agonist / usual dose - no GLP1 instructions:  SGLT-2 inhibitors / usual dose - no SGLT-2 instructions:   Blood Thinner / Instructions:no Aspirin Instructions:no  Comments:   Activity level: Patient is able  to climb a flight of stairs without difficulty; [x]  No CP  [x]  No SOB, ___   Patient can  perform ADLs without assistance.   Anesthesia review:   Patient denies shortness of breath, fever, cough and chest pain at PAT appointment.  Patient verbalized understanding and agreement to the Pre-Surgical Instructions that were given to them at this PAT appointment. Patient was also educated of the need to review these PAT instructions again prior to his/her surgery.I reviewed the appropriate phone numbers to call if they have any and questions or concerns.

## 2023-09-07 NOTE — Patient Instructions (Addendum)
SURGICAL WAITING ROOM VISITATION  Patients having surgery or a procedure may have no more than 2 support people in the waiting area - these visitors may rotate.    Children under the age of 8 must have an adult with them who is not the patient.  Due to an increase in RSV and influenza rates and associated hospitalizations, children ages 72 and under may not visit patients in [facility] hospitals.  If the patient needs to stay at the hospital during part of their recovery, the visitor guidelines for inpatient rooms apply. Pre-op nurse will coordinate an appropriate time for 1 support person to accompany patient in pre-op.  This support person may not rotate.    Please refer to the [facility] website for the visitor guidelines for Inpatients (after your surgery is over and you are in a regular room).       Your procedure is scheduled on: 09/20/23   Report to [facility] Main Entrance    Report to admitting at  10:15 AM   Call this number if you have problems the morning of surgery [phone]   Do not eat food :After Midnight.   After Midnight you may have the following liquids until 9:30 AM  DAY OF SURGERY  Water Non-Citrus Juices (without pulp, NO RED-Apple, White grape, White cranberry) Black Coffee (NO MILK/CREAM OR CREAMERS, sugar ok)  Clear Tea (NO MILK/CREAM OR CREAMERS, sugar ok) regular and decaf                             Plain Jell-O (NO RED)                                           Fruit ices (not with fruit pulp, NO RED)                                     Popsicles (NO RED)                                                               Sports drinks like Gatorade (NO RED)                   FOLLOW BOWEL PREP AND ANY ADDITIONAL PRE OP INSTRUCTIONS YOU RECEIVED FROM YOUR SURGEON'S OFFICE!!!     Oral Hygiene is also important to reduce your risk of infection.                                    Remember - BRUSH YOUR TEETH THE MORNING OF SURGERY WITH YOUR  REGULAR TOOTHPASTE   Do NOT smoke after Midnight   Stop all vitamins and herbal supplements 7 days before surgery.   Take these medicines the morning of surgery with A SIP OF WATER: Prozac, Loratadine, Pantoprazole and Rosuvastatin             You may not have any metal on your body including hair pins, jewelry, and body piercing  Do not wear make-up, lotions, powders, perfumes/cologne, or deodorant              Men may shave face and neck.   Do not bring valuables to the hospital. [facility] IS NOT             RESPONSIBLE   FOR VALUABLES.   Contacts, glasses, dentures or bridgework may not be worn into surgery.   Bring small overnight bag day of surgery.   DO NOT BRING YOUR HOME MEDICATIONS TO THE HOSPITAL. PHARMACY WILL DISPENSE MEDICATIONS LISTED ON YOUR MEDICATION LIST TO YOU DURING YOUR ADMISSION IN THE HOSPITAL!    Patients discharged on the day of surgery will not be allowed to drive home.  Someone NEEDS to stay with you for the first 24 hours after anesthesia.   Special Instructions: Bring a copy of your healthcare power of attorney and living will documents the day of surgery if you haven't scanned them before.              Please read over the following fact sheets you were given: IF YOU HAVE QUESTIONS ABOUT YOUR PRE-OP INSTRUCTIONS PLEASE CALL [phone] [facility]   If you received a COVID test during your pre-op visit  it is requested that you wear a mask when out in public, stay away from anyone that may not be feeling well and notify your surgeon if you develop symptoms. If you test positive for Covid or have been in contact with anyone that has tested positive in the last 10 days please notify you surgeon.    [facility] - Preparing for Surgery Before surgery, you can play an important role.  Because skin is not sterile, your skin needs to be as free of germs as possible.  You can reduce the number of germs on your skin by washing with CHG (chlorahexidine  gluconate) soap before surgery.  CHG is an antiseptic cleaner which kills germs and bonds with the skin to continue killing germs even after washing. Please DO NOT use if you have an allergy to CHG or antibacterial soaps.  If your skin becomes reddened/irritated stop using the CHG and inform your nurse when you arrive at Short Stay. Do not shave (including legs and underarms) for at least 48 hours prior to the first CHG shower.  You may shave your face/neck.  Please follow these instructions carefully:  1.  Shower with CHG Soap the night before surgery and the  morning of surgery.  2.  If you choose to wash your hair, wash your hair first as usual with your normal  shampoo.  3.  After you shampoo, rinse your hair and body thoroughly to remove the shampoo.                             4.  Use CHG as you would any other liquid soap.  You can apply chg directly to the skin and wash.  Gently with a scrungie or clean washcloth.  5.  Apply the CHG Soap to your body ONLY FROM THE NECK DOWN.   Do   not use on face/ open                           Wound or open sores. Avoid contact with eyes, ears mouth and   genitals (private parts).  Wash face,  Genitals (private parts) with your normal soap.             6.  Wash thoroughly, paying special attention to the area where your    surgery  will be performed.  7.  Thoroughly rinse your body with warm water from the neck down.  8.  DO NOT shower/wash with your normal soap after using and rinsing off the CHG Soap.                9.  Pat yourself dry with a clean towel.            10.  Wear clean pajamas.            11.  Place clean sheets on your bed the night of your first shower and do not  sleep with pets. Day of Surgery : Do not apply any lotions/deodorants the morning of surgery.  Please wear clean clothes to the hospital/surgery center.  FAILURE TO FOLLOW THESE INSTRUCTIONS MAY RESULT IN THE CANCELLATION OF YOUR SURGERY  PATIENT  SIGNATURE_________________________________  NURSE SIGNATURE__________________________________  ________________________________________________________________________

## 2023-09-07 NOTE — Progress Notes (Signed)
Please send preop orders for PST appointment 08/09/23.

## 2023-09-09 ENCOUNTER — Other Ambulatory Visit: Payer: Self-pay

## 2023-09-09 ENCOUNTER — Encounter (HOSPITAL_COMMUNITY)
Admission: RE | Admit: 2023-09-09 | Discharge: 2023-09-09 | Disposition: A | Discharge status: Home / Self Care | Payer: BC Managed Care – PPO | Source: Ambulatory Visit | Attending: Surgery | Admitting: Surgery

## 2023-09-09 ENCOUNTER — Encounter (HOSPITAL_COMMUNITY): Payer: Self-pay

## 2023-09-09 VITALS — BP 134/97 | HR 71 | Temp 98.1°F | Ht 67.0 in | Wt 186.0 lb

## 2023-09-09 DIAGNOSIS — I10 Essential (primary) hypertension: Secondary | ICD-10-CM | POA: Diagnosis not present

## 2023-09-09 DIAGNOSIS — Z01818 Encounter for other preprocedural examination: Secondary | ICD-10-CM | POA: Diagnosis present

## 2023-09-09 HISTORY — DX: Malignant (primary) neoplasm, unspecified: C80.1

## 2023-09-09 LAB — BASIC METABOLIC PANEL
Anion gap: 11 (ref 5–15)
BUN: 16 mg/dL (ref 6–20)
CO2: 21 mmol/L — ABNORMAL LOW (ref 22–32)
Calcium: 9.5 mg/dL (ref 8.9–10.3)
Chloride: 104 mmol/L (ref 98–111)
Creatinine, Ser: 0.89 mg/dL (ref 0.61–1.24)
GFR, Estimated: >60 mL/min (ref >60)
Glucose, Bld: 106 mg/dL — ABNORMAL HIGH (ref 70–99)
Potassium: 4.2 mmol/L (ref 3.5–5.1)
Sodium: 136 mmol/L (ref 135–145)

## 2023-09-09 LAB — CBC
HCT: 45.2 % (ref 39.0–52.0)
Hemoglobin: 15.5 g/dL (ref 13.0–17.0)
MCH: 32.4 pg (ref 26.0–34.0)
MCHC: 34.3 g/dL (ref 30.0–36.0)
MCV: 94.6 fL (ref 80.0–100.0)
Platelets: 202 10*3/uL (ref 150–400)
RBC: 4.78 MIL/uL (ref 4.22–5.81)
RDW: 12.5 % (ref 11.5–15.5)
WBC: 6.8 10*3/uL (ref 4.0–10.5)
nRBC: 0 % (ref 0.0–0.2)

## 2023-09-20 ENCOUNTER — Encounter (HOSPITAL_COMMUNITY): Payer: Self-pay | Admitting: Surgery

## 2023-09-20 ENCOUNTER — Ambulatory Visit (HOSPITAL_COMMUNITY)
Admission: RE | Admit: 2023-09-20 | Discharge: 2023-09-21 | Disposition: A | Discharge status: Home / Self Care | Payer: BC Managed Care – PPO | Attending: Surgery | Admitting: Surgery

## 2023-09-20 ENCOUNTER — Ambulatory Visit (HOSPITAL_COMMUNITY): Payer: BC Managed Care – PPO | Admitting: Anesthesiology

## 2023-09-20 ENCOUNTER — Encounter (HOSPITAL_COMMUNITY)
Admission: RE | Disposition: A | Discharge status: Home / Self Care | Payer: Self-pay | Source: Home / Self Care | Attending: Surgery

## 2023-09-20 ENCOUNTER — Other Ambulatory Visit: Payer: Self-pay

## 2023-09-20 DIAGNOSIS — K439 Ventral hernia without obstruction or gangrene: Secondary | ICD-10-CM | POA: Diagnosis present

## 2023-09-20 DIAGNOSIS — I10 Essential (primary) hypertension: Secondary | ICD-10-CM | POA: Insufficient documentation

## 2023-09-20 DIAGNOSIS — Z87891 Personal history of nicotine dependence: Secondary | ICD-10-CM | POA: Diagnosis not present

## 2023-09-20 DIAGNOSIS — K4021 Bilateral inguinal hernia, without obstruction or gangrene, recurrent: Secondary | ICD-10-CM | POA: Diagnosis not present

## 2023-09-20 DIAGNOSIS — K219 Gastro-esophageal reflux disease without esophagitis: Secondary | ICD-10-CM | POA: Diagnosis not present

## 2023-09-20 DIAGNOSIS — M1990 Unspecified osteoarthritis, unspecified site: Secondary | ICD-10-CM | POA: Insufficient documentation

## 2023-09-20 DIAGNOSIS — Z79899 Other long term (current) drug therapy: Secondary | ICD-10-CM | POA: Insufficient documentation

## 2023-09-20 DIAGNOSIS — K432 Incisional hernia without obstruction or gangrene: Secondary | ICD-10-CM | POA: Insufficient documentation

## 2023-09-20 HISTORY — PX: SCAR REVISION: SHX5285

## 2023-09-20 HISTORY — PX: XI ROBOTIC ASSISTED VENTRAL HERNIA: SHX6789

## 2023-09-20 LAB — CBC
HCT: 41.8 % (ref 39.0–52.0)
Hemoglobin: 13.8 g/dL (ref 13.0–17.0)
MCH: 31.9 pg (ref 26.0–34.0)
MCHC: 33 g/dL (ref 30.0–36.0)
MCV: 96.5 fL (ref 80.0–100.0)
Platelets: 204 10*3/uL (ref 150–400)
RBC: 4.33 MIL/uL (ref 4.22–5.81)
RDW: 12.7 % (ref 11.5–15.5)
WBC: 13.4 10*3/uL — ABNORMAL HIGH (ref 4.0–10.5)
nRBC: 0 % (ref 0.0–0.2)

## 2023-09-20 LAB — CREATININE, SERUM
Creatinine, Ser: 0.95 mg/dL (ref 0.61–1.24)
GFR, Estimated: >60 mL/min (ref >60)

## 2023-09-20 SURGERY — REPAIR, HERNIA, VENTRAL, ROBOT-ASSISTED
Anesthesia: General

## 2023-09-20 MED ORDER — SUGAMMADEX SODIUM 200 MG/2ML IV SOLN
INTRAVENOUS | Status: DC | PRN
  Administered 2023-09-20: 200 mg via INTRAVENOUS

## 2023-09-20 MED ORDER — FENTANYL CITRATE (PF) 100 MCG/2ML IJ SOLN
INTRAMUSCULAR | Status: DC | PRN
  Administered 2023-09-20: 100 ug via INTRAVENOUS

## 2023-09-20 MED ORDER — PROCHLORPERAZINE EDISYLATE 10 MG/2ML IJ SOLN: 10.0000 mg | INTRAMUSCULAR | Status: DC | PRN

## 2023-09-20 MED ORDER — CEFAZOLIN SODIUM-DEXTROSE 2-4 GM/100ML-% IV SOLN
2.0000 g | INTRAVENOUS | Status: AC
  Administered 2023-09-20: 2 g via INTRAVENOUS
  Filled 2023-09-20: qty 100

## 2023-09-20 MED ORDER — GABAPENTIN 300 MG PO CAPS
300.0000 mg | ORAL_CAPSULE | Freq: Three times a day (TID) | ORAL | Status: DC
  Administered 2023-09-20 – 2023-09-21 (×3): 300 mg via ORAL
  Filled 2023-09-20 (×3): qty 1

## 2023-09-20 MED ORDER — OXYCODONE HCL 5 MG PO TABS
ORAL_TABLET | ORAL | Status: AC
  Filled 2023-09-20: qty 1

## 2023-09-20 MED ORDER — GLYCOPYRROLATE 0.2 MG/ML IJ SOLN
INTRAMUSCULAR | Status: DC | PRN
  Administered 2023-09-20 (×2): .1 mg via INTRAVENOUS

## 2023-09-20 MED ORDER — PANTOPRAZOLE SODIUM 40 MG PO TBEC
40.0000 mg | DELAYED_RELEASE_TABLET | Freq: Every day | ORAL | Status: DC
  Administered 2023-09-21: 40 mg via ORAL
  Filled 2023-09-20: qty 1

## 2023-09-20 MED ORDER — OXYCODONE HCL 5 MG PO TABS: 10.0000 mg | ORAL_TABLET | ORAL | Status: DC | PRN

## 2023-09-20 MED ORDER — DEXAMETHASONE SODIUM PHOSPHATE 10 MG/ML IJ SOLN
INTRAMUSCULAR | Status: DC | PRN
  Administered 2023-09-20: 4 mg via INTRAVENOUS

## 2023-09-20 MED ORDER — ROCURONIUM BROMIDE 10 MG/ML (PF) SYRINGE
PREFILLED_SYRINGE | INTRAVENOUS | Status: AC
  Filled 2023-09-20: qty 10

## 2023-09-20 MED ORDER — FLUOXETINE HCL 10 MG PO CAPS
10.0000 mg | ORAL_CAPSULE | Freq: Every day | ORAL | Status: DC
  Administered 2023-09-21: 10 mg via ORAL
  Filled 2023-09-20: qty 1

## 2023-09-20 MED ORDER — LORATADINE 10 MG PO TABS
10.0000 mg | ORAL_TABLET | Freq: Every day | ORAL | Status: DC
  Filled 2023-09-20: qty 1

## 2023-09-20 MED ORDER — ESMOLOL HCL 100 MG/10ML IV SOLN
INTRAVENOUS | Status: DC | PRN
  Administered 2023-09-20 (×2): 20 mg via INTRAVENOUS

## 2023-09-20 MED ORDER — EPHEDRINE 5 MG/ML INJ
INTRAVENOUS | Status: AC
  Filled 2023-09-20: qty 5

## 2023-09-20 MED ORDER — LACTATED RINGERS IV SOLN: INTRAVENOUS | Status: DC | PRN

## 2023-09-20 MED ORDER — ENOXAPARIN SODIUM 40 MG/0.4ML IJ SOSY
40.0000 mg | PREFILLED_SYRINGE | INTRAMUSCULAR | Status: DC
Start: 2023-09-21 — End: 2023-09-21
  Filled 2023-09-20: qty 0.4

## 2023-09-20 MED ORDER — ORAL CARE MOUTH RINSE: 15.0000 mL | Freq: Once | OROMUCOSAL | Status: AC

## 2023-09-20 MED ORDER — PHENYLEPHRINE HCL-NACL 20-0.9 MG/250ML-% IV SOLN
INTRAVENOUS | Status: DC | PRN
  Administered 2023-09-20: 15 ug/min via INTRAVENOUS

## 2023-09-20 MED ORDER — ALBUMIN HUMAN 5 % IV SOLN: INTRAVENOUS | Status: DC | PRN

## 2023-09-20 MED ORDER — ACETAMINOPHEN 500 MG PO TABS
1000.0000 mg | ORAL_TABLET | ORAL | Status: AC
  Administered 2023-09-20: 1000 mg via ORAL
  Filled 2023-09-20: qty 2

## 2023-09-20 MED ORDER — LIDOCAINE HCL (PF) 2 % IJ SOLN
INTRAMUSCULAR | Status: AC
  Filled 2023-09-20: qty 5

## 2023-09-20 MED ORDER — LACTATED RINGERS IV SOLN: INTRAVENOUS | Status: DC

## 2023-09-20 MED ORDER — PHENYLEPHRINE 80 MCG/ML (10ML) SYRINGE FOR IV PUSH (FOR BLOOD PRESSURE SUPPORT)
PREFILLED_SYRINGE | INTRAVENOUS | Status: AC
  Filled 2023-09-20: qty 10

## 2023-09-20 MED ORDER — ALBUMIN HUMAN 5 % IV SOLN
INTRAVENOUS | Status: AC
  Filled 2023-09-20: qty 500

## 2023-09-20 MED ORDER — AZELASTINE-FLUTICASONE 137-50 MCG/ACT NA SUSP: 1.0000 | Freq: Every day | NASAL | Status: DC

## 2023-09-20 MED ORDER — FENTANYL CITRATE PF 50 MCG/ML IJ SOSY: 25.0000 ug | PREFILLED_SYRINGE | INTRAMUSCULAR | Status: DC | PRN

## 2023-09-20 MED ORDER — GLYCOPYRROLATE 0.2 MG/ML IJ SOLN
INTRAMUSCULAR | Status: AC
  Filled 2023-09-20: qty 1

## 2023-09-20 MED ORDER — ESMOLOL HCL 100 MG/10ML IV SOLN
INTRAVENOUS | Status: AC
  Filled 2023-09-20: qty 10

## 2023-09-20 MED ORDER — DOCUSATE SODIUM 100 MG PO CAPS
100.0000 mg | ORAL_CAPSULE | Freq: Two times a day (BID) | ORAL | Status: DC
  Administered 2023-09-20 – 2023-09-21 (×2): 100 mg via ORAL
  Filled 2023-09-20 (×2): qty 1

## 2023-09-20 MED ORDER — METHOCARBAMOL 1000 MG/10ML IJ SOLN: 500.0000 mg | Freq: Four times a day (QID) | INTRAMUSCULAR | Status: DC | PRN

## 2023-09-20 MED ORDER — HYDROMORPHONE HCL 1 MG/ML IJ SOLN
INTRAMUSCULAR | Status: DC | PRN
  Administered 2023-09-20 (×2): .5 mg via INTRAVENOUS

## 2023-09-20 MED ORDER — 0.9 % SODIUM CHLORIDE (POUR BTL) OPTIME
TOPICAL | Status: DC | PRN
  Administered 2023-09-20: 1000 mL

## 2023-09-20 MED ORDER — FLUTICASONE PROPIONATE 50 MCG/ACT NA SUSP
1.0000 | Freq: Every day | NASAL | Status: DC
  Filled 2023-09-20: qty 16

## 2023-09-20 MED ORDER — PROPOFOL 10 MG/ML IV BOLUS
INTRAVENOUS | Status: DC | PRN
  Administered 2023-09-20: 200 mg via INTRAVENOUS

## 2023-09-20 MED ORDER — ROCURONIUM BROMIDE 100 MG/10ML IV SOLN
INTRAVENOUS | Status: DC | PRN
  Administered 2023-09-20: 60 mg via INTRAVENOUS
  Administered 2023-09-20: 20 mg via INTRAVENOUS

## 2023-09-20 MED ORDER — BUPIVACAINE-EPINEPHRINE 0.25% -1:200000 IJ SOLN
INTRAMUSCULAR | Status: DC | PRN
  Administered 2023-09-20: 30 mL

## 2023-09-20 MED ORDER — OXYCODONE HCL 5 MG/5ML PO SOLN: 5.0000 mg | Freq: Once | ORAL | Status: AC | PRN

## 2023-09-20 MED ORDER — GABAPENTIN 300 MG PO CAPS
300.0000 mg | ORAL_CAPSULE | ORAL | Status: AC
  Administered 2023-09-20: 300 mg via ORAL
  Filled 2023-09-20: qty 1

## 2023-09-20 MED ORDER — CHLORHEXIDINE GLUCONATE CLOTH 2 % EX PADS: 6.0000 | MEDICATED_PAD | Freq: Once | CUTANEOUS | Status: DC

## 2023-09-20 MED ORDER — SIMETHICONE 80 MG PO CHEW: 80.0000 mg | CHEWABLE_TABLET | Freq: Four times a day (QID) | ORAL | Status: DC | PRN

## 2023-09-20 MED ORDER — OXYCODONE HCL 5 MG PO TABS
5.0000 mg | ORAL_TABLET | Freq: Once | ORAL | Status: AC | PRN
  Administered 2023-09-20: 5 mg via ORAL

## 2023-09-20 MED ORDER — ACETAMINOPHEN 160 MG/5ML PO SOLN: 325.0000 mg | ORAL | Status: DC | PRN

## 2023-09-20 MED ORDER — ONDANSETRON HCL 4 MG/2ML IJ SOLN
INTRAMUSCULAR | Status: DC | PRN
  Administered 2023-09-20 (×2): 4 mg via INTRAVENOUS

## 2023-09-20 MED ORDER — HYDROMORPHONE HCL 1 MG/ML IJ SOLN: 0.5000 mg | INTRAMUSCULAR | Status: DC | PRN

## 2023-09-20 MED ORDER — BUPIVACAINE-EPINEPHRINE 0.25% -1:200000 IJ SOLN
INTRAMUSCULAR | Status: AC
  Filled 2023-09-20: qty 1

## 2023-09-20 MED ORDER — KETOROLAC TROMETHAMINE 15 MG/ML IJ SOLN
15.0000 mg | Freq: Three times a day (TID) | INTRAMUSCULAR | Status: DC
  Administered 2023-09-20 – 2023-09-21 (×2): 15 mg via INTRAVENOUS
  Filled 2023-09-20 (×2): qty 1

## 2023-09-20 MED ORDER — MIDAZOLAM HCL 5 MG/5ML IJ SOLN
INTRAMUSCULAR | Status: DC | PRN
  Administered 2023-09-20: 2 mg via INTRAVENOUS

## 2023-09-20 MED ORDER — MIDAZOLAM HCL 2 MG/2ML IJ SOLN
INTRAMUSCULAR | Status: AC
  Filled 2023-09-20: qty 2

## 2023-09-20 MED ORDER — CHLORHEXIDINE GLUCONATE 0.12 % MT SOLN
15.0000 mL | Freq: Once | OROMUCOSAL | Status: AC
  Administered 2023-09-20: 15 mL via OROMUCOSAL

## 2023-09-20 MED ORDER — ROSUVASTATIN CALCIUM 20 MG PO TABS
20.0000 mg | ORAL_TABLET | Freq: Every day | ORAL | Status: DC
  Filled 2023-09-20: qty 1

## 2023-09-20 MED ORDER — HYDROMORPHONE HCL 2 MG/ML IJ SOLN
INTRAMUSCULAR | Status: AC
  Filled 2023-09-20: qty 1

## 2023-09-20 MED ORDER — BUPIVACAINE LIPOSOME 1.3 % IJ SUSP
INTRAMUSCULAR | Status: AC
  Filled 2023-09-20: qty 20

## 2023-09-20 MED ORDER — PHENYLEPHRINE 80 MCG/ML (10ML) SYRINGE FOR IV PUSH (FOR BLOOD PRESSURE SUPPORT)
PREFILLED_SYRINGE | INTRAVENOUS | Status: DC | PRN
  Administered 2023-09-20: 80 ug via INTRAVENOUS

## 2023-09-20 MED ORDER — LISINOPRIL 10 MG PO TABS
10.0000 mg | ORAL_TABLET | Freq: Every day | ORAL | Status: DC
  Administered 2023-09-20 – 2023-09-21 (×2): 10 mg via ORAL
  Filled 2023-09-20 (×2): qty 1

## 2023-09-20 MED ORDER — ONDANSETRON HCL 4 MG/2ML IJ SOLN: 4.0000 mg | Freq: Four times a day (QID) | INTRAMUSCULAR | Status: DC | PRN

## 2023-09-20 MED ORDER — ACETAMINOPHEN 325 MG PO TABS: 325.0000 mg | ORAL_TABLET | ORAL | Status: DC | PRN

## 2023-09-20 MED ORDER — DEXAMETHASONE SODIUM PHOSPHATE 10 MG/ML IJ SOLN
INTRAMUSCULAR | Status: AC
  Filled 2023-09-20: qty 1

## 2023-09-20 MED ORDER — OXYCODONE-ACETAMINOPHEN 5-325 MG PO TABS: 1.0000 | ORAL_TABLET | ORAL | 0 refills | Status: AC | PRN

## 2023-09-20 MED ORDER — ALBUTEROL SULFATE HFA 108 (90 BASE) MCG/ACT IN AERS
INHALATION_SPRAY | RESPIRATORY_TRACT | Status: AC
  Filled 2023-09-20: qty 6.7

## 2023-09-20 MED ORDER — DROPERIDOL 2.5 MG/ML IJ SOLN: 0.6250 mg | Freq: Once | INTRAMUSCULAR | Status: DC | PRN

## 2023-09-20 MED ORDER — LIDOCAINE HCL (CARDIAC) PF 100 MG/5ML IV SOSY
PREFILLED_SYRINGE | INTRAVENOUS | Status: DC | PRN
  Administered 2023-09-20: 100 mg via INTRAVENOUS

## 2023-09-20 MED ORDER — OXYCODONE HCL 5 MG PO TABS
5.0000 mg | ORAL_TABLET | ORAL | Status: DC | PRN
  Administered 2023-09-21: 5 mg via ORAL
  Filled 2023-09-20: qty 1

## 2023-09-20 MED ORDER — ACETAMINOPHEN 325 MG PO TABS
650.0000 mg | ORAL_TABLET | Freq: Four times a day (QID) | ORAL | Status: DC
  Administered 2023-09-20 – 2023-09-21 (×3): 650 mg via ORAL
  Filled 2023-09-20 (×3): qty 2

## 2023-09-20 MED ORDER — KETOROLAC TROMETHAMINE 15 MG/ML IJ SOLN
15.0000 mg | INTRAMUSCULAR | Status: AC
  Administered 2023-09-20: 15 mg via INTRAVENOUS
  Filled 2023-09-20: qty 1

## 2023-09-20 MED ORDER — ONDANSETRON HCL 4 MG/2ML IJ SOLN
INTRAMUSCULAR | Status: AC
  Filled 2023-09-20: qty 2

## 2023-09-20 MED ORDER — FENTANYL CITRATE (PF) 100 MCG/2ML IJ SOLN
INTRAMUSCULAR | Status: AC
  Filled 2023-09-20: qty 2

## 2023-09-20 MED ORDER — BUPIVACAINE LIPOSOME 1.3 % IJ SUSP
20.0000 mL | Freq: Once | INTRAMUSCULAR | Status: AC
  Administered 2023-09-20: 20 mL

## 2023-09-20 MED ORDER — ACETAMINOPHEN 10 MG/ML IV SOLN
1000.0000 mg | Freq: Once | INTRAVENOUS | Status: DC | PRN
Start: 2023-09-20 — End: 2023-09-20

## 2023-09-20 MED ORDER — METHOCARBAMOL 750 MG PO TABS: 750.0000 mg | ORAL_TABLET | Freq: Four times a day (QID) | ORAL | 0 refills | Status: AC | PRN

## 2023-09-20 SURGICAL SUPPLY — 65 items
ANTIFOG SOL W/FOAM PAD STRL (MISCELLANEOUS) ×2 IMPLANT
BAG COUNTER SPONGE SURGICOUNT (BAG) ×2 IMPLANT
BLADE SURG SZ11 CARB STEEL (BLADE) ×2 IMPLANT
CHLORAPREP W/TINT 26 (MISCELLANEOUS) ×2 IMPLANT
COVER MAYO STAND STRL (DRAPES) ×2 IMPLANT
COVER TIP SHEARS 8 DVNC (MISCELLANEOUS) ×2 IMPLANT
DERMABOND ADVANCED .7 DNX12 (GAUZE/BANDAGES/DRESSINGS) IMPLANT
DEVICE TROCAR PUNCTURE CLOSURE (ENDOMECHANICALS) IMPLANT
DRAPE ARM DVNC X/XI (DISPOSABLE) ×6 IMPLANT
DRAPE COLUMN DVNC XI (DISPOSABLE) ×2 IMPLANT
DRIVER NDL LRG 8 DVNC XI (INSTRUMENTS) ×4 IMPLANT
DRIVER NDL MEGA SUTCUT DVNCXI (INSTRUMENTS) ×4 IMPLANT
DRIVER NDLE LRG 8 DVNC XI (INSTRUMENTS) ×4 IMPLANT
DRIVER NDLE MEGA SUTCUT DVNCXI (INSTRUMENTS) ×4 IMPLANT
ELECT L-HOOK LAP 45CM DISP (ELECTROSURGICAL) ×2 IMPLANT
ELECT PENCIL ROCKER SW 15FT (MISCELLANEOUS) ×2 IMPLANT
ELECT REM PT RETURN 15FT ADLT (MISCELLANEOUS) ×2 IMPLANT
ELECTRODE L-HOOK LAP 45CM DISP (ELECTROSURGICAL) ×2 IMPLANT
FORCEPS PROGRASP DVNC XI (FORCEP) ×2 IMPLANT
GLOVE BIO SURGEON STRL SZ7.5 (GLOVE) ×4 IMPLANT
GLOVE INDICATOR 8.0 STRL GRN (GLOVE) ×4 IMPLANT
GOWN STRL REUS W/ TWL XL LVL3 (GOWN DISPOSABLE) ×6 IMPLANT
GOWN STRL REUS W/TWL XL LVL3 (GOWN DISPOSABLE) ×6
GRASPER SUT TROCAR 14GX15 (MISCELLANEOUS) IMPLANT
GRASPER TIP-UP FEN DVNC XI (INSTRUMENTS) ×2 IMPLANT
IRRIG SUCT STRYKERFLOW 2 WTIP (MISCELLANEOUS) IMPLANT
IRRIGATION SUCT STRKRFLW 2 WTP (MISCELLANEOUS) IMPLANT
KIT BASIN OR (CUSTOM PROCEDURE TRAY) ×2 IMPLANT
KIT TURNOVER KIT A (KITS) IMPLANT
MANIFOLD NEPTUNE II (INSTRUMENTS) ×2 IMPLANT
MESH SOFT 12X12IN BARD (Mesh General) IMPLANT
NDL SPNL 18GX3.5 QUINCKE PK (NEEDLE) ×2 IMPLANT
NEEDLE SPNL 18GX3.5 QUINCKE PK (NEEDLE) ×2 IMPLANT
PACK CARDIOVASCULAR III (CUSTOM PROCEDURE TRAY) ×2 IMPLANT
SCISSORS MNPLR CVD DVNC XI (INSTRUMENTS) ×2 IMPLANT
SEAL UNIV 5-12 XI (MISCELLANEOUS) ×6 IMPLANT
SET TUBE SMOKE EVAC HIGH FLOW (TUBING) ×2 IMPLANT
SOL ELECTROSURG ANTI STICK (MISCELLANEOUS) ×2 IMPLANT
SOLUTION ANTFG W/FOAM PAD STRL (MISCELLANEOUS) ×2 IMPLANT
SOLUTION ELECTROSURG ANTI STCK (MISCELLANEOUS) ×2 IMPLANT
SPIKE FLUID TRANSFER (MISCELLANEOUS) ×2 IMPLANT
SUT ETHIBOND 0 36 GRN (SUTURE) IMPLANT
SUT MNCRL AB 4-0 PS2 18 (SUTURE) ×2 IMPLANT
SUT STRAFIX PDS 18 CTX (SUTURE) IMPLANT
SUT STRAFIX SYMMETRIC 0-0 24 (SUTURE) IMPLANT
SUT STRAFIX SYMMETRIC 1-0 12 (SUTURE) IMPLANT
SUT STRAFIX SYMMETRIC 1-0 24 (SUTURE) ×4 IMPLANT
SUT STRTFX SPIRAL PDS+ 2-0 23 (SUTURE) ×2 IMPLANT
SUT VIC AB 2-0 SH 27 (SUTURE) ×2
SUT VIC AB 2-0 SH 27X BRD (SUTURE) IMPLANT
SUT VIC AB 3-0 SH 27 (SUTURE) ×2
SUT VIC AB 3-0 SH 27XBRD (SUTURE) IMPLANT
SUT VICRYL 0 UR6 27IN ABS (SUTURE) IMPLANT
SUT VLOC 180 0 6IN GS21 (SUTURE) IMPLANT
SUT VLOC 180 0 9IN GS21 (SUTURE) IMPLANT
SUTURE STRAFIX SYMMETRC 0-0 24 (SUTURE) IMPLANT
SUTURE STRAFIX SYMMETRC 1-0 12 (SUTURE) IMPLANT
SUTURE STRAFIX SYMMETRC 1-0 24 (SUTURE) IMPLANT
SUTURE STRTFX SPRL PDS+ 2-0 23 (SUTURE) IMPLANT
SYR 20ML LL LF (SYRINGE) ×2 IMPLANT
TAPE STRIPS DRAPE STRL (GAUZE/BANDAGES/DRESSINGS) ×2 IMPLANT
TOWEL OR 17X26 10 PK STRL BLUE (TOWEL DISPOSABLE) ×2 IMPLANT
TOWEL OR NON WOVEN STRL DISP B (DISPOSABLE) IMPLANT
TROCAR ADV FIXATION 12X100MM (TROCAR) ×2 IMPLANT
TROCAR Z-THREAD FIOS 5X100MM (TROCAR) ×2 IMPLANT

## 2023-09-20 NOTE — Op Note (Signed)
Patient: Roger Haney (10-Feb-1976, [id number])  Date of Surgery: 09/20/2023  Preoperative Diagnosis: INCISIONAL HERNIA (3.0 cm wide by 5.1 cm tall on preoperative CT) AND BILATERAL INGUINAL HERNIAS   Postoperative Diagnosis: INCISIONAL HERNIA (3.0 cm wide by 5.1 cm tall)AND BILATERAL INGUINAL HERNIAS   Surgical Procedure:  XI ROBOTIC ASSISTED VENTRAL HERNIA REPAIR WITH MESH  BILATERAL POSTERIOR RECTUS MYOFASIAL RELEASE XI ROBOTIC ASSISTED BILATERAL INGUINAL HERNIA REPAIR WITH MESH REVISION OF MIDLINE LAPAROTOMY SCAR  Operative Team Members:  Surgeons and Role:    * Winfred Redel, Hyman Hopes, MD - Primary   Anesthesiologist: Shelton Silvas, MD CRNA: Laurita Quint, CRNA; Epimenio Sarin, CRNA; Floydene Flock, CRNA   Anesthesia: General   Fluids:  Total I/O In: 658.3 [I.V.:58.3; IV Piggyback:600] Out: 470 [Urine:445; Blood:25]  Complications: None  Drains:  None  Specimen: None  Disposition:  PACU - hemodynamically stable.  Plan of Care: Admit for overnight observation  Indications for Procedure:  Mr. Mcginley had a complex surgical history after a fall off a bobcat and now has developed a incisional hernia near his umbilicus. He also has some symptoms in his groin. CT shows a 3 cm wide by 5.1 cm tall ventral hernia defect in the midline periumbilical region as well as bilateral recurrent fat containing inguinal hernias. I recommended robotic incisional hernia repair with mesh and bilateral posterior rectus myofascial release, robotic bilateral recurrent inguinal hernia repair with mesh, and possible bilateral transversus abdominis myofascial release and revision of midline laparotomy scar. We discussed the procedure itself as well as its risk, benefits, and alternatives. After full discussion and all questions answered the patient granted consent to proceed.    Findings:  Hernia Location: Ventral hernia location: Umbilical (M3) Hernia Size:  3.0 cm wide by 5.1 cm  tall Mesh Size &Type:  37 cm tall x 19 cm wide Bard Soft Mesh Mesh Position: Sublay - Retromuscular Myofascial Releases: Bilateral posterior rectus myofascial release   Description of Procedure: The patient was positioned supine, moderately flexed at the umbilical level, padded and secured on the operating table.  A timeout procedure was performed.    What is described is a robotic, totally extraperitoneal retromuscular incisional hernia repair with bilateral rectus myofascial release and retromuscular mesh placement.  Laparoscopic Portion: The retrorectus space was entered in the LEFT hypochondrium, at approximately the midclavicular line utilizing a 5 mm optical-viewing trocar.  Upon safe entry into this space, it was insufflated while performing a blunt dissection with the camera still in the optical trocar. A rectus myofascial release was performed on the LEFT side. Dissection was carried out laterally in the retromuscular plane to the edge of the rectus sheath progressively disconnecting the rectus muscle from the underlying posterior rectus sheath. Both the segmental innervation as well as the intercostal artery and vein brances to the rectus muscle were individually preserved.    During the left sided retrorectus dissection, a 12 mm trocar was placed into the lateral most edge of the retrorectus space.  With these initial trocars in position, the medial most aspect of the retrorectus plane was identified, and the posterior sheath was visualized as it inserted on the linea alba. The posterior sheath was incised with cautery entering the preperitoneal plane. A crossover was performed dissecting under the linea alba in the preperitoneal plane until the right rectus sheath was identified.  After identification of the right rectus sheath, it was incised vertically to enter the retrorectus space on the right. A rectus myofascial release  was performed on the RIGHT side.  Blunt dissection was carried  out laterally in the retromuscular plane to the edge of the rectus sheath progressively disconnecting the rectus muscle from the underlying posterior rectus sheath. Both the segmental innervation as well as the intercostal artery and vein brances to the rectus muscle were individually preserved.   At this juncture, both retrorectus planes were initially connected to each other and there was space for further trocar placement. An 8 mm robotic trocar was placed in the midclavicular line in right retrorectus space.  A 8mm robotic trocar was placed within the left rectus musculature in the upper abdomen, and not through the linea alba.  The initial 5 mm access trocar in the midclavicular line within the left retrorectus space was switched out for an 8 mm robotic trocar.   Robotic Portion: The Intuitive daVinci Xi surgical robot was docked in the standard fashion and the procedure begun from the robotic console. A Prograsp instrument and monopolar shears were used for the dissection.  Dissection was carried down inferiorly preserving the peritoneum and the preperitoneal fat in the midline as it was gently dissected off of the overlying linea alba.  On the right side, the posterior rectus sheath was progressively disconnected from its insertion on the linea alba. This allowed for progression of the right side rectus myofascial release.  The rectus myofascial release accomplished medialization of the posterior rectus sheath towards the midline and disinsertion of the rectus muscle from its surrounding fascia, and thus its encasement in the rectus sheath, allowing for widening of the rectus muscle and transfer of the rectus flap towards the midline.  This will allow for future inset of the medial aspect of the flap for abdominal wall reconstruction.  Similarly, on the left side, the posterior rectus sheath was also progressively disconnected from its insertion on the linea alba.  This allowed for progression of the  left side rectus myofascial release.  The rectus myofascial release accomplished medialization of the posterior rectus sheath towards the midline and disinsertion of the rectus muscle from its surrounding fascia, and thus its encasement in the rectus sheath, allowing for widening of the rectus muscle and transfer of the rectus flap towards the midline.  This will allow for future inset of the medial aspect of the flap for abdominal wall reconstruction.  During the dissection of the midline the hernia defect was identified and the hernia sac was reduced to preserve integrity of the visceral sac. A few small peritoneal defects were closed with running 2-0 Ethicon Stratafix Spiral PDS suture.  Both the left and the right rectus myofascial releases were performed towards the lower abdomen, past the arcuate line bilaterally.  During this dissection, the peritoneum and preperitoneal fat in the midline were further preserved below the hernia as they were dissected off of the overlying linea alba.   The hernia defect area was now visualized fully.  The hernia defects were located in the umbilical regions.   There was an indirect fat containing hernia on the RIGHT.  Dissection was carried out in the pre peritoneal space down to the level of the hernia sac which was reduced into the peritoneal cavity completely.  The cord contents were parietalized and preserved.  A large pre peritoneal dissection was performed to uncover the direct, indirect, femoral and obturator spaces.  Cooper's ligament was uncovered medially and the psoas muscle uncovered laterally.  There was an indirect fat containing hernia on the LEFT.  Dissection was carried out  in the pre peritoneal space down to the level of the hernia sac which was reduced into the peritoneal cavity completely.  The cord contents were parietalized and preserved.  A large pre peritoneal dissection was performed to uncover the direct, indirect, femoral and obturator  spaces.  Cooper's ligament was uncovered medially and the psoas muscle uncovered laterally.  The midline hernia defect was closed utilizing a continuous, #1 Ethicon Stratafix Symmetric PDS Plus suture.  The hernia defect, and subsequently the rectus musculature, came together well for a complete abdominal wall reconstruction.  The dissected out retrorectus space was measured with a metric ruler so as to determine the size of the proposed mesh.    A piece of Bard Soft was opened and trimmed to 37 cm tall x 19 cm wide. The mesh was advanced into the retrorectus space and the mesh positioned flat against the intact posterior rectus sheaths. The mesh was fixated to Cooper's ligament bilaterally using 0 Ethibond suture.   The robot was undocked and the laparoscope was inserted, inspecting for hemostasis.    A transversus abdominis plane (TAP) block was performed bilaterally with a mixture of marcaine and Exparel.  The anesthetic was first injected into the plane between the transversus abdominis and internal abdominal oblique muscles on the left. The TAP was repeated on the contralateral side.   The midline laparotomy scar was excised using a scalpel and monopolar cautery.  The umbilical stalk was divided.  The umbilical stalk was fixed to the abdominal fascia using a 3-0 vicryl suture.  I re approximated the skin edges using 3-0 vicryl suture then closed skin with 4-0 Monocryl.  The trocars were removed and the skin closed with 4-0 Monocryl subcuticular sutures and skin glue.    Ivar Drape, MD General, Bariatric, & Minimally Invasive Surgery [facility], [place]

## 2023-09-20 NOTE — Anesthesia Procedure Notes (Signed)
Procedure Name: Intubation Date/Time: 09/20/2023 11:30 AM  Performed by: Laurita Quint, CRNAPre-anesthesia Checklist: Patient identified, Emergency Drugs available, Suction available and Patient being monitored Patient Re-evaluated:Patient Re-evaluated prior to induction Oxygen Delivery Method: Circle System Utilized Preoxygenation: Pre-oxygenation with 100% oxygen Induction Type: IV induction Ventilation: Mask ventilation without difficulty Laryngoscope Size: Mac and 3 Grade View: Grade I Tube type: Oral Tube size: 7.5 mm Number of attempts: 1 Airway Equipment and Method: Stylet and Oral airway Placement Confirmation: ETT inserted through vocal cords under direct vision, positive ETCO2 and breath sounds checked- equal and bilateral Secured at: 22 cm Tube secured with: Tape Dental Injury: Teeth and Oropharynx as per pre-operative assessment

## 2023-09-20 NOTE — H&P (Signed)
Admitting Physician: Hyman Hopes Claretta Kendra  Service: General Surgery  CC: Hernia  Subjective   HPI: Roger Haney is an 47 y.o. male who is here for hernia repair.  Past Medical History:  Diagnosis Date   Arthritis    Cancer (HCC)    small intestine   GERD (gastroesophageal reflux disease)    H/O seasonal allergies     Past Surgical History:  Procedure Laterality Date   BILATERAL INTRAMEDULLARY (IM) NAIL TIBIAL Left 12/13/2019   Procedure: INTRAMEDULLARY (IM) NAIL TIBIAL;  Surgeon: [name], MD;  Location: [facility];  Service: Orthopedics;  Laterality: Left;  NO BLOCK   BOWEL RESECTION     HERNIA REPAIR     x2   INGUINAL HERNIA REPAIR     KNEE ARTHROSCOPY      History reviewed. No pertinent family history.  Social:  reports that he has quit smoking. His smoking use included cigarettes. He has never used smokeless tobacco. He reports that he does not currently use alcohol. He reports that he does not use drugs.  Allergies:  Allergies  Allergen Reactions   Cat Hair Extract Hives   Other Hives and Other (See Comments)    Sutures - made with animal tissue/cats Sutures - made with animal tissue/cats     Medications: Current Outpatient Medications  Medication Instructions   Azelastine-Fluticasone 137-50 MCG/ACT SUSP 1 spray, Each Nare, Daily at bedtime   azithromycin (ZITHROMAX) 250 mg, Oral, Daily, Take first 2 tablets together, then 1 every day until finished.   chlorpheniramine-HYDROcodone (TUSSIONEX PENNKINETIC ER) 10-8 MG/5ML SUER 5 mLs, Oral, At bedtime PRN   FLUoxetine (PROZAC) 10 mg, Oral, Daily   fluticasone (FLONASE) 50 MCG/ACT nasal spray 1 spray, Each Nare, Daily   lisinopril (ZESTRIL) 10 mg, Oral, Daily   loratadine (CLARITIN) 10 mg, Oral, Daily   Multiple Vitamin (MULTIVITAMIN) tablet 1 tablet, Oral, Daily   oseltamivir (TAMIFLU) 75 mg, Oral, Every 12 hours   pantoprazole (PROTONIX) 40 mg, Oral, Daily   rosuvastatin (CRESTOR)  20 mg, Oral, Daily    ROS - all of the below systems have been reviewed with the patient and positives are indicated with bold text General: chills, fever or night sweats Eyes: blurry vision or double vision ENT: epistaxis or sore throat Allergy/Immunology: itchy/watery eyes or nasal congestion Hematologic/Lymphatic: bleeding problems, blood clots or swollen lymph nodes Endocrine: temperature intolerance or unexpected weight changes Breast: new or changing breast lumps or nipple discharge Resp: cough, shortness of breath, or wheezing CV: chest pain or dyspnea on exertion GI: as per HPI GU: dysuria, trouble voiding, or hematuria MSK: joint pain or joint stiffness Neuro: TIA or stroke symptoms Derm: pruritus and skin lesion changes Psych: anxiety and depression  Objective   PE Blood pressure (!) 131/94, pulse 72, temperature 98.6 F (37 C), temperature source Oral, resp. rate 18, height 5\' 7"  (1.702 m), weight 84.4 kg, SpO2 98%. Constitutional: NAD; conversant; no deformities Eyes: Moist conjunctiva; no lid lag; anicteric; PERRL Neck: Trachea midline; no thyromegaly Lungs: Normal respiratory effort; no tactile fremitus CV: RRR; no palpable thrills; no pitting edema GI: Abd -wide midline scar with hernia bulging to the left of the umbilicus approximately 3 cm x 3 cm fascial defect is palpable on exam but hard to tell if there is other small defects along the incision as well. No palpable inguinal hernias on exam  MSK: Normal range of motion of extremities; no clubbing/cyanosis Psychiatric: Appropriate affect; alert and oriented x3 Lymphatic:  No palpable cervical or axillary lymphadenopathy  No results found for this or any previous visit (from the past 24 hour(s)).  Imaging Orders  No imaging studies ordered today   CT abd/Pel 06/25/23  1. Hepatic fatty infiltration. 2. Anterior abdominal wall and bilateral inguinal hernias noted containing fat. 3. No bowel obstruction,  abscess or inflammatory change.  3cm wide by 5.1 cm tall ventral hernia defect.   Assessment and Plan   Hernia of abdominal wall  Bilateral recurrent inguinal hernia without obstruction or gangrene   Roger Haney had a complex surgical history after a fall off a bobcat and now has developed a incisional hernia near his umbilicus. He also has some symptoms in his groin. CT shows a 3 cm wide by 5.1 cm tall ventral hernia defect in the midline periumbilical region as well as bilateral recurrent fat containing inguinal hernias. I recommended robotic incisional hernia repair with mesh and bilateral posterior rectus myofascial release, robotic bilateral recurrent inguinal hernia repair with mesh, and possible bilateral transversus abdominis myofascial release and revision of midline laparotomy scar. We discussed the procedure itself as well as its risk, benefits, and alternatives. After full discussion and all questions answered the patient granted consent to proceed.   Quentin Ore, MD  [facility] Use [email or web address] to contact on call provider

## 2023-09-20 NOTE — Transfer of Care (Signed)
Immediate Anesthesia Transfer of Care Note  Patient: Roger Haney  Procedure(s) Performed: XI ROBOTIC ASSISTED VENTRAL HERNIA REPAIR WITH MESH BILATERAL POSTERIOR RECTUS MYOFASIAL RELEASE XI ROBOTIC ASSISTED BILATERAL INGUINAL HERNIA REPAIR WITH MESH (Bilateral) REVISION OF MIDLINE LAPAROTOMY SCAR  Patient Location: PACU  Anesthesia Type:General  Level of Consciousness: awake, alert , and oriented  Airway & Oxygen Therapy: Patient Spontanous Breathing and Patient connected to face mask oxygen  Post-op Assessment: Report given to RN and Post -op Vital Haney reviewed and stable  Post vital Haney: Reviewed and stable  Last Vitals:  Vitals Value Taken Time  BP 142/104 09/20/23 1501  Temp 37 C 09/20/23 1455  Pulse 105 09/20/23 1503  Resp 15 09/20/23 1503  SpO2 93 % 09/20/23 1503  Vitals shown include unfiled device data.  Last Pain:  Vitals:   09/20/23 0912  TempSrc:   PainSc: 0-No pain         Complications: No notable events documented.

## 2023-09-20 NOTE — Discharge Instructions (Signed)
 VENTRAL HERNIA REPAIR POST OPERATIVE INSTRUCTIONS  Thinking Clearly  The anesthesia may cause you to feel different for 1 or 2 days. Do not drive, drink alcohol, or make any big decisions for at least 2 days.  Nutrition When you wake up, you will be able to drink small amounts of liquid. If you do not feel sick, you can slowly advance your diet to regular foods. Continue to drink lots of fluids, usually about 8 to 10 glasses per day. Eat a high-fiber diet so you don't strain during bowel movements. High-Fiber Foods Foods high in fiber include beans, bran cereals and whole-grain breads, peas, dried fruit (figs, apricots, and dates), raspberries, blackberries, strawberries, sweet corn, broccoli, baked potatoes with skin, plums, pears, apples, greens, and nuts. Activity Slowly increase your activity. Be sure to get up and walk every hour or so to prevent blood clots. No heavy lifting or strenuous activity for 4 weeks following surgery to prevent hernias at your incision sites or recurrence of your hernia. It is normal to feel tired. You may need more sleep than usual.  Get your rest but make sure to get up and move around frequently to prevent blood clots and pneumonia.  Work and Return to School You can go back to work when you feel well enough. Discuss the timing with your surgeon. You can usually go back to school or work 1 week or less after an laparoscopic or an open repair. If your work requires heavy lifting or strenuous activity you need to be placed on light duty for 4 weeks following surgery. You can return to gym class, sports or other physical activities 4 weeks after surgery.  Wound Care You may experience significant bruising throughout the abdominal wall that may track down into the groin including into the scrotum in males.  Rest, elevating the groin and scrotum above the level of the heart, ice and compression with tight fitting underwear or an abdominal binder can help.   Always wash your hands before and after touching near your incision site. Do not soak in a bathtub until cleared at your follow up appointment. You may take a shower 24 hours after surgery. A small amount of drainage from the incision is normal. If the drainage is thick and yellow or the site is red, you may have an infection, so call your surgeon. If you have a drain in one of your incisions, it will be taken out in office when the drainage stops. Steri-Strips will fall off in 7 to 10 days or they will be removed during your first office visit. If you have dermabond glue covering over the incision, allow the glue to flake off on its own. Protect the new skin, especially from the sun. The sun can burn and cause darker scarring. Your scar will heal in about 4 to 6 weeks and will become softer and continue to fade over the next year.  The cosmetic appearance of the incisions will improve over the course of the first year after surgery. Sensation around your incision will return in a few weeks or months.  Bowel Movements After intestinal surgery, you may have loose watery stools for several days. If watery diarrhea lasts longer than 3 days, contact your surgeon. Pain medication (narcotics) can cause constipation. Increase the fiber in your diet with high-fiber foods if you are constipated. You can take an over the counter stool softener like Colace to avoid constipation.  Additional over the counter medications can also be used   if Colace isn't sufficient (for example, Milk of Magnesia or Miralax).  Pain The amount of pain is different for each person. Some people need only 1 to 3 doses of pain control medication, while others need more. Take alternating doses of tylenol and ibuprofen around the clock for the first five days following surgery.  This will provide a baseline of pain control and help with inflammation.  Take the narcotic pain medication in addition if needed for severe pain.  Contact  Your Surgeon at [phone], if you have: Pain that will not go away Pain that gets worse A fever of more than 101F (38.3C) Repeated vomiting Swelling, redness, bleeding, or bad-smelling drainage from your wound site Strong abdominal pain No bowel movement or unable to pass gas for 3 days Watery diarrhea lasting longer than 3 days  Pain Control The goal of pain control is to minimize pain, keep you moving and help you heal. Your surgical team will work with you on your pain plan. Most often a combination of therapies and medications are used to control your pain. You may also be given medication (local anesthetic) at the surgical site. This may help control your pain for several days. Extreme pain puts extra stress on your body at a time when your body needs to focus on healing. Do not wait until your pain has reached a level "10" or is unbearable before telling your doctor or nurse. It is much easier to control pain before it becomes severe. Following a laparoscopic procedure, pain is sometimes felt in the shoulder. This is due to the gas inserted into your abdomen during the procedure. Moving and walking helps to decrease the gas and the right shoulder pain.  Use the guide below for ways to manage your post-operative pain. Learn more by going to facs.org/safepaincontrol.  How Intense Is My Pain Common Therapies to Feel Better       I hardly notice my pain, and it does not interfere with my activities.  I notice my pain and it distracts me, but I can still do activities (sitting up, walking, standing).  Non-Medication Therapies  Ice (in a bag, applied over clothing at the surgical site), elevation, rest, meditation, massage, distraction (music, TV, play) walking and mild exercise Splinting the abdomen with pillows +  Non-Opioid Medications Acetaminophen (Tylenol) Non-steroidal anti-inflammatory drugs (NSAIDS) Aspirin, Ibuprofen (Motrin, Advil) Naproxen (Aleve) Take these as  needed, when you feel pain. Both acetaminophen and NSAIDs help to decrease pain and swelling (inflammation).      My pain is hard to ignore and is more noticeable even when I rest.  My pain interferes with my usual activities.  Non-Medication Therapies  +  Non-Opioid medications  Take on a regular schedule (around-the-clock) instead of as needed. (For example, Tylenol every 6 hours at 9:00 am, 3:00 pm, 9:00 pm, 3:00 am and Motrin every 6 hours at 12:00 am, 6:00 am, 12:00 pm, 6:00 pm)         I am focused on my pain, and I am not doing my daily activities.  I am groaning in pain, and I cannot sleep. I am unable to do anything.  My pain is as bad as it could be, and nothing else matters.  Non-Medication Therapies  +  Around-the-Clock Non-Opioid Medications  +  Short-acting opioids  Opioids should be used with other medications to manage severe pain. Opioids block pain and give a feeling of euphoria (feel high). Addiction, a serious side effect of opioids, is   rare with short-term (a few days) use.  Examples of short-acting opioids include: Tramadol (Ultram), Hydrocodone (Norco, Vicodin), Hydromorphone (Dilaudid), Oxycodone (Oxycontin)     The above directions have been adapted from the American [employer] Surgical Patient Education Program.  Please refer to the ACS website if needed: https://[email or web address].org/-/media/files/education/[email or web address]   Paul Stechschulte, MD [facility] [address], [address], [place], [place]  [address] ?  [address] [address], [place], [place]   [address] [phone] ? [phone] ? FAX [phone] Web site: [email or web address]  

## 2023-09-20 NOTE — Anesthesia Preprocedure Evaluation (Addendum)
Anesthesia Evaluation  Patient identified by MRN, date of birth, ID band Patient awake    Reviewed: Allergy & Precautions, NPO status , Patient's Chart, lab work & pertinent test results  Airway Mallampati: II  TM Distance: >3 FB Neck ROM: Full    Dental  (+) Teeth Intact, Dental Advisory Given   Pulmonary former smoker   breath sounds clear to auscultation       Cardiovascular hypertension, Pt. on medications  Rhythm:Regular Rate:Normal     Neuro/Psych negative neurological ROS  negative psych ROS   GI/Hepatic Neg liver ROS,GERD  Medicated,,  Endo/Other  negative endocrine ROS    Renal/GU negative Renal ROS     Musculoskeletal  (+) Arthritis ,    Abdominal   Peds  Hematology negative hematology ROS (+)   Anesthesia Other Findings   Reproductive/Obstetrics                             Anesthesia Physical Anesthesia Plan  ASA: 2  Anesthesia Plan: General   Post-op Pain Management: Tylenol PO (pre-op)*   Induction: Intravenous  PONV Risk Score and Plan: 3 and Ondansetron, Dexamethasone and Midazolam  Airway Management Planned: Oral ETT  Additional Equipment: None  Intra-op Plan:   Post-operative Plan: Extubation in OR  Informed Consent: I have reviewed the patients History and Physical, chart, labs and discussed the procedure including the risks, benefits and alternatives for the proposed anesthesia with the patient or authorized representative who has indicated his/her understanding and acceptance.     Dental advisory given  Plan Discussed with: CRNA  Anesthesia Plan Comments:        Anesthesia Quick Evaluation

## 2023-09-21 ENCOUNTER — Encounter (HOSPITAL_COMMUNITY): Payer: Self-pay | Admitting: Surgery

## 2023-09-21 DIAGNOSIS — K432 Incisional hernia without obstruction or gangrene: Secondary | ICD-10-CM | POA: Diagnosis not present

## 2023-09-21 NOTE — Progress Notes (Signed)
AVS reviewed w/ pt & wife who verbalized an understanding. No other questions at this time. PIV x 2 removed as documented. Pt fressed - to lobby in w/c . Prior to pt leaving, RN identified medication that was dropped per pt - claritan, MAR updated.  Pt to take med at home as needed. Primary nurse updated

## 2023-09-21 NOTE — Discharge Summary (Signed)
  Patient ID: Roger Haney [id number] 47 y.o. August 21, 1976  09/20/2023  Discharge date and time: 09/21/2023  Admitting Physician: Hyman Hopes Nimra Puccinelli  Discharge Physician: Hyman Hopes Chanese Hartsough  Admission Diagnoses: Ventral hernia [K43.9] Patient Active Problem List   Diagnosis Date Noted   Ventral hernia 09/20/2023     Discharge Diagnoses:  Patient Active Problem List   Diagnosis Date Noted   Ventral hernia 09/20/2023    Operations: Procedure(s): XI ROBOTIC ASSISTED VENTRAL HERNIA REPAIR WITH MESH BILATERAL POSTERIOR RECTUS MYOFASIAL RELEASE XI ROBOTIC ASSISTED BILATERAL INGUINAL HERNIA REPAIR WITH MESH REVISION OF MIDLINE LAPAROTOMY SCAR  Admission Condition: good  Discharged Condition: good  Indication for Admission: Incisional and left inguinal hernias  Hospital Course: Robotic hernia repair  Consults: None  Significant Diagnostic Studies: None  Treatments: surgery: as above  Disposition: Home  Patient Instructions:  Allergies as of 09/21/2023       Reactions   Cat Hair Extract Hives   Other Hives, Other (See Comments)   Sutures - made with animal tissue/cats Sutures - made with animal tissue/cats        Medication List     TAKE these medications    Azelastine-Fluticasone 137-50 MCG/ACT Susp Place 1 spray into both nostrils at bedtime.   azithromycin 250 MG tablet Commonly known as: ZITHROMAX Take 1 tablet (250 mg total) by mouth daily. Take first 2 tablets together, then 1 every day until finished.   chlorpheniramine-HYDROcodone 10-8 MG/5ML Suer Commonly known as: Tussionex Pennkinetic ER Take 5 mLs by mouth at bedtime as needed for cough.   FLUoxetine 10 MG capsule Commonly known as: PROZAC Take 10 mg by mouth daily.   fluticasone 50 MCG/ACT nasal spray Commonly known as: FLONASE Place 1 spray into both nostrils daily.   lisinopril 10 MG tablet Commonly known as: ZESTRIL Take 10 mg by mouth daily.   loratadine 10 MG  tablet Commonly known as: CLARITIN Take 10 mg by mouth daily.   methocarbamol 750 MG tablet Commonly known as: Robaxin-750 Take 1 tablet (750 mg total) by mouth every 6 (six) hours as needed for muscle spasms.   multivitamin tablet Take 1 tablet by mouth daily.   oseltamivir 75 MG capsule Commonly known as: TAMIFLU Take 1 capsule (75 mg total) by mouth every 12 (twelve) hours.   oxyCODONE-acetaminophen 5-325 MG tablet Commonly known as: Percocet Take 1 tablet by mouth every 4 (four) hours as needed for severe pain (pain score 7-10).   pantoprazole 40 MG tablet Commonly known as: PROTONIX Take 40 mg by mouth daily.   rosuvastatin 20 MG tablet Commonly known as: CRESTOR Take 20 mg by mouth daily.        Activity: no heavy lifting for 4 weeks Diet: regular diet Wound Care: keep wound clean and dry  Follow-up:  With Dr. Dossie Der in 4 weeks.  Signed: Hyman Hopes [name], [place]   09/21/2023, 7:50 AM

## 2023-09-21 NOTE — Anesthesia Postprocedure Evaluation (Signed)
Anesthesia Post Note  Patient: BRAYLENN MOHAMMADI  Procedure(s) Performed: XI ROBOTIC ASSISTED VENTRAL HERNIA REPAIR WITH MESH BILATERAL POSTERIOR RECTUS MYOFASIAL RELEASE XI ROBOTIC ASSISTED BILATERAL INGUINAL HERNIA REPAIR WITH MESH (Bilateral) REVISION OF MIDLINE LAPAROTOMY SCAR     Patient location during evaluation: PACU Anesthesia Type: General Level of consciousness: awake and alert Pain management: pain level controlled Vital Signs Assessment: post-procedure vital signs reviewed and stable Respiratory status: spontaneous breathing, nonlabored ventilation, respiratory function stable and patient connected to nasal cannula oxygen Cardiovascular status: blood pressure returned to baseline and stable Postop Assessment: no apparent nausea or vomiting Anesthetic complications: no   No notable events documented.          Shelton Silvas

## 2023-09-21 NOTE — Progress Notes (Signed)
   09/21/23 0848  TOC Brief Assessment  Insurance and Status Reviewed  Patient has primary care physician Yes  Home environment has been reviewed Resides with spouse  Prior level of function: Independent at baseline  Prior/Current Home Services No current home services  Social Determinants of Health Reivew SDOH reviewed no interventions necessary  Readmission risk has been reviewed Yes  Transition of care needs no transition of care needs at this time

## 2024-02-14 ENCOUNTER — Emergency Department (HOSPITAL_BASED_OUTPATIENT_CLINIC_OR_DEPARTMENT_OTHER)
Admission: EM | Admit: 2024-02-14 | Discharge: 2024-02-14 | Disposition: A | Discharge status: Home / Self Care | Payer: Worker's Compensation | Attending: Emergency Medicine | Admitting: Emergency Medicine

## 2024-02-14 ENCOUNTER — Other Ambulatory Visit: Payer: Self-pay

## 2024-02-14 ENCOUNTER — Encounter (HOSPITAL_BASED_OUTPATIENT_CLINIC_OR_DEPARTMENT_OTHER): Payer: Self-pay | Admitting: Emergency Medicine

## 2024-02-14 DIAGNOSIS — L03012 Cellulitis of left finger: Secondary | ICD-10-CM | POA: Diagnosis not present

## 2024-02-14 DIAGNOSIS — M79642 Pain in left hand: Secondary | ICD-10-CM | POA: Diagnosis present

## 2024-02-14 NOTE — ED Provider Notes (Signed)
 [facility] Provider Note   CSN: [id number] Arrival date & time: 02/14/24  1715     History  Chief Complaint  Patient presents with   Foreign Body in Skin    Roger Haney is a 48 y.o. male.  Patient presents to the emergency department today for evaluation of left hand pain.  Patient was working with a [employer] 2 days ago.  He he sustained a wound to the base of the middle finger on the left hand at the MCP joint area.  Patient was concerned that he had a wooden splinter.  He did not see a splinter.  He explored the wound himself but was not able to remove any foreign bodies.  Patient has had pain in the hand and digit since that time.  Patient with occupational health today.  He had his tetanus updated and was prescribed doxycycline and cephalexin which he had not filled yet.  He plans to do this today.  He was sent for further evaluation.       Home Medications Prior to Admission medications   Medication Sig Start Date End Date Taking? Authorizing Provider  Azelastine-Fluticasone 137-50 MCG/ACT SUSP Place 1 spray into both nostrils at bedtime. 07/29/22   [provider]  FLUoxetine (PROZAC) 10 MG capsule Take 10 mg by mouth daily. 08/02/23   [provider]  fluticasone (FLONASE) 50 MCG/ACT nasal spray Place 1 spray into both nostrils daily. 07/13/17   [provider]  lisinopril (ZESTRIL) 10 MG tablet Take 10 mg by mouth daily. 08/02/23   [provider]  loratadine (CLARITIN) 10 MG tablet Take 10 mg by mouth daily.    [provider]  methocarbamol (ROBAXIN-750) 750 MG tablet Take 1 tablet (750 mg total) by mouth every 6 (six) hours as needed for muscle spasms. 09/20/23   Stechschulte, Avon Boers, MD  Multiple Vitamin (MULTIVITAMIN) tablet Take 1 tablet by mouth daily.    [provider]  oxyCODONE-acetaminophen (PERCOCET) 5-325 MG tablet Take 1 tablet by mouth every 4 (four) hours as  needed for severe pain (pain score 7-10). 09/20/23 09/19/24  Stechschulte, Avon Boers, MD  pantoprazole (PROTONIX) 40 MG tablet Take 40 mg by mouth daily. 01/31/20   [provider]  rosuvastatin (CRESTOR) 20 MG tablet Take 20 mg by mouth daily. 08/02/23 08/01/24  [provider]      Allergies    Cat dander and Other    Review of Systems   Review of Systems  Physical Exam Updated Vital Signs BP (!) 124/96   Pulse 90   Temp 97.8 F (36.6 C) (Oral)   Resp 16   Wt 88 kg   SpO2 97%   BMI 30.38 kg/m  Physical Exam Vitals and nursing note reviewed.  Constitutional:      Appearance: He is well-developed.  HENT:     Head: Normocephalic and atraumatic.  Eyes:     Conjunctiva/sclera: Conjunctivae normal.  Pulmonary:     Effort: No respiratory distress.  Musculoskeletal:     Cervical back: Normal range of motion and neck supple.  Skin:    General: Skin is warm and dry.     Comments: Right hand: On the volar aspect of the hand at the MCP crease there appears to be a healing puncture wound.  There is a small overlying scab.  There is not extensive redness or cellulitis.  Patient is able to flex and extend at the joint without  problem.  It is tender to palpation.  On palpation of the soft tissue, I do not palpate any definite foreign body.  There is no sign of active drainage.  Patient with 4 negative Knavel signs: Patient holds the finger straight and not in significant flexion, he is able to extend without significant pain, no fusiform swelling over the volar aspect, there is significant tenderness over the flexor tendon itself except in the localized area of puncture wound.  Normal distal sensation.  Cap refill less than 2 seconds in the nailbed.  Neurological:     Mental Status: He is alert.     ED Results / Procedures / Treatments   Labs (all labs ordered are listed, but only abnormal results are displayed) Labs Reviewed - No data to  display  EKG None  Radiology No results found.  Procedures Procedures    Medications Ordered in ED Medications - No data to display  ED Course/ Medical Decision Making/ A&P    Patient seen and examined. History obtained directly from patient.  Reviewed the notes that he brought with him from occupational health.  Agree with prescribed antibiotics.  Labs/EKG: None ordered  Imaging: Considered x-ray, however with splinter would not be radiopaque.  Medications/Fluids: None ordered  Most recent vital signs reviewed and are as follows: BP (!) 124/96   Pulse 90   Temp 97.8 F (36.6 C) (Oral)   Resp 16   Wt 88 kg   SpO2 97%   BMI 30.38 kg/m   Initial impression: Puncture wound with localized cellulitis, appears to be superficial at this time, no signs of flexor tendon sheath infection at this time.  I used a bedside ultrasound to further explore the wound and evaluate for visible foreign body.  EMERGENCY DEPARTMENT US SOFT TISSUE INTERPRETATION "Study: Limited Soft Tissue Ultrasound"  INDICATIONS: Pain, Soft tissue infection, and possible foreign body Multiple views of the body part were obtained in real-time with a multi-frequency linear probe  PERFORMED BY: Myself IMAGES ARCHIVED?: No SIDE:Left BODY PART: hand INTERPRETATION:  Cellulitis present, no discrete fluid collection present.  I do not visualize any foreign body or targets with shadowing that could represent foreign body on ultrasound.   6:38 PM patient comfortable discharge.  Reviewed pertinent lab work and imaging with patient at bedside.  We discussed as I cannot palpate any definitive foreign body or visualize any foreign body with ultrasound, I do not think that would be wise to perform incision and drainage due to risk of injury to deeper structures.  I do not see any fluid collections or abscesses which would need to be open at this time.    Current plan is to begin antibiotics, perform good wound  care and monitor closely.  We discussed signs of flexor tendon sheath infection and need to return if any of these signs develop.  Most current vital signs reviewed and are as follows: BP (!) 124/96   Pulse 90   Temp 97.8 F (36.6 C) (Oral)   Resp 16   Wt 88 kg   SpO2 97%   BMI 30.38 kg/m   Plan: Discharge to home.  Patient will monitor symptoms.  If he is gradually improving in regards to pain and does not develop any dysfunction digit, he will monitor until resolution of symptoms and completion of antibiotics.  If he is not appreciably better or worse, he will return in 72 hours for wound recheck.  If he develops any warning signs prior to this, he  will return sooner.  Prescriptions written for: None  Other home care instructions discussed: Fill and take antibiotics, warm soaks, protect wound if possibility of contamination.  ED return instructions discussed: Signs of worsening infection, increasing pain, redness, swelling, signs of flexor tenosynovitis discussed with patient.  Follow-up instructions discussed: Follow-up as per plan above.                               Medical Decision Making  Patient with puncture wound at the base of the finger.  Unclear if patient has a organic foreign body.  I cannot palpate a foreign body or visualize a foreign body on ultrasound today.  On ultrasound, superficial infection noted. 0/4 Kanavel signs.  I do not feel that patient has a flexor tendon sheath infection at this time.  No signs concerning for septic arthritis at this time.  Patient has normal range of motion of the finger and digit but does have some discomfort.  He was prescribed antibiotics by occupational health, I agree with these antibiotics.  He will continue good wound care and monitor symptoms closely.  I have no reasons he cannot return to work if he can tolerate the pain and can protect the wound from contamination.        Final Clinical Impression(s) / ED Diagnoses Final  diagnoses:  Cellulitis of finger of left hand    Rx / DC Orders ED Discharge Orders     None         Lyna Sandhoff, PA-C 02/14/24 1842    Merdis Stalling, MD 02/14/24 [phone]

## 2024-02-14 NOTE — ED Triage Notes (Signed)
 Reports splinter to left middle finger x 2 days , sent here from occupational heath for possible finger infection . Swelling redness and drainage noted .

## 2024-02-14 NOTE — Discharge Instructions (Signed)
 Please read and follow all provided instructions.  Your diagnoses today include:  1. Cellulitis of finger of left hand    Tests performed today include: Vital signs. See below for your results today.   Medications prescribed:  None  Take any prescribed medications only as directed.   Home care instructions:  Follow any educational materials contained in this packet. Keep affected area above the level of your heart when possible. Wash area gently twice a day with warm soapy water. Do not apply alcohol or hydrogen peroxide. Cover the area if it draining or weeping.   Please take the doxycycline and cephalexin prescribed by occupational health.  These are appropriate antibiotics.  Follow-up instructions: Return to the Emergency Department in 72 hours for a recheck if your symptoms are not significantly improved.   Please return sooner if the area becomes more red or swollen, if you have swelling that involves the entire finger, difficulty extending the finger, tenderness over the palm and finger that is becoming worse.  Also return if you have redness that begins streaking up your wrist and arm.  Return instructions:  Return to the Emergency Department if you have: Fever Worsening symptoms Worsening pain Worsening swelling Redness of the skin that moves away from the affected area, especially if it streaks away from the affected area  Any other emergent concerns  Your vital signs today were: BP (!) 124/96   Pulse 90   Temp 97.8 F (36.6 C) (Oral)   Resp 16   Wt 88 kg   SpO2 97%   BMI 30.38 kg/m  If your blood pressure (BP) was elevated above 135/85 this visit, please have this repeated by your doctor within one month. --------------
